# Patient Record
Sex: Male | Born: 1980 | Hispanic: Yes | Marital: Married | Smoking: Never smoker
Health system: Southern US, Community
[De-identification: ages and names within clinical notes are randomized; demographics above are authoritative.]

## PROBLEM LIST (undated history)

## (undated) DIAGNOSIS — N200 Calculus of kidney: Secondary | ICD-10-CM

## (undated) DIAGNOSIS — T884XXA Failed or difficult intubation, initial encounter: Secondary | ICD-10-CM

## (undated) DIAGNOSIS — N529 Male erectile dysfunction, unspecified: Secondary | ICD-10-CM

## (undated) DIAGNOSIS — E7800 Pure hypercholesterolemia, unspecified: Secondary | ICD-10-CM

## (undated) HISTORY — DX: Male erectile dysfunction, unspecified: N52.9

## (undated) HISTORY — PX: LITHOTRIPSY: SUR834

---

## 2005-09-24 ENCOUNTER — Emergency Department: Payer: Self-pay | Admitting: Emergency Medicine

## 2005-12-16 ENCOUNTER — Ambulatory Visit: Payer: Self-pay | Admitting: Specialist

## 2006-09-19 ENCOUNTER — Ambulatory Visit: Payer: Self-pay | Admitting: Specialist

## 2007-09-21 ENCOUNTER — Ambulatory Visit: Payer: Self-pay | Admitting: Specialist

## 2007-10-08 IMAGING — CT CT ABD-PELV W/O CM
1 of 2 series · 15 of 32 positions shown, 19 images · non-contrast
Comparison: none

REASON FOR EXAM: lt flank pain kidney stone disease
COMMENTS:

[Series 2: stone · axial · 0.75mm/px · z∈[-737,-314]mm · 15 of 159 slices shown, 19 images]
[im 12/159  soft-tissue]
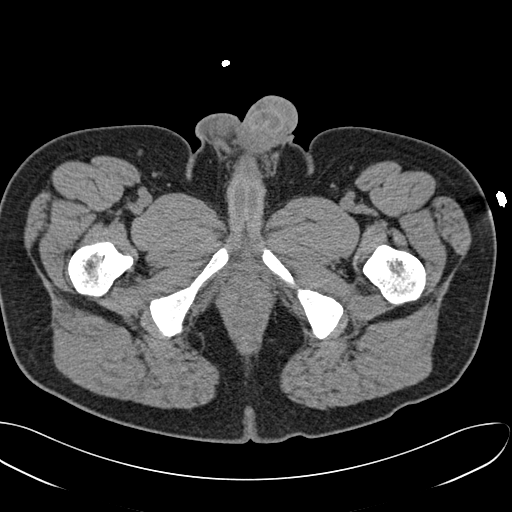
[im 12/159  bone]
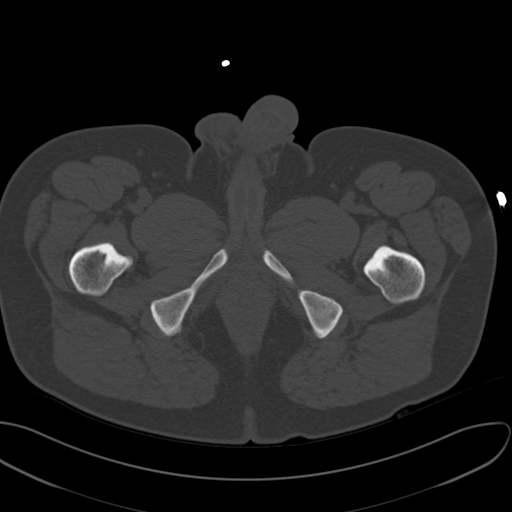
[im 23/159  soft-tissue]
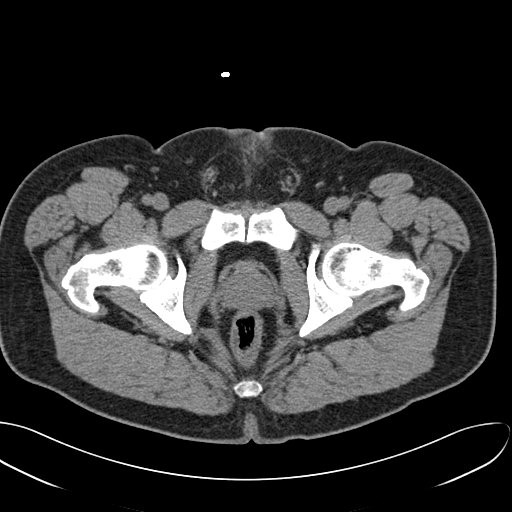
[im 34/159  soft-tissue]
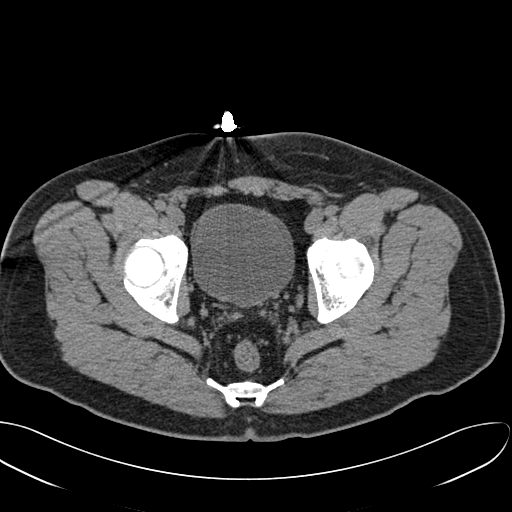
[im 46/159  soft-tissue]
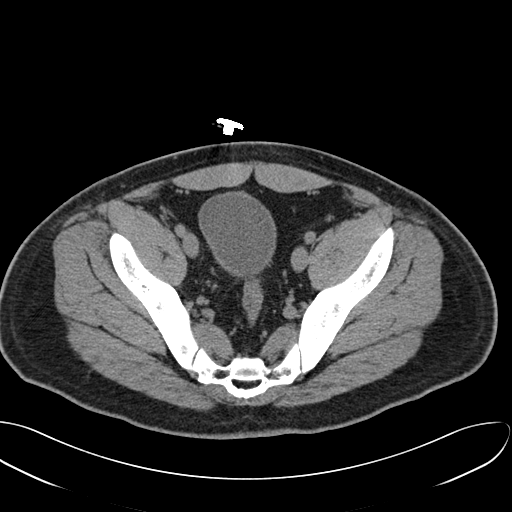
[im 57/159  soft-tissue]
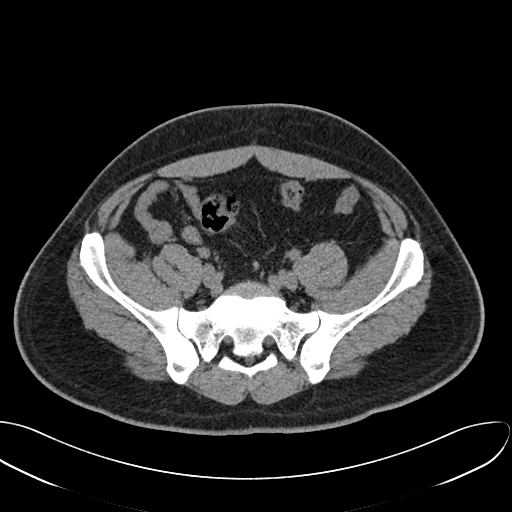
[im 68/159  soft-tissue]
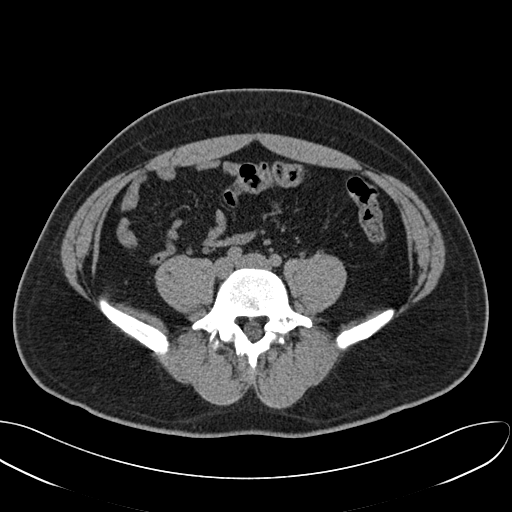
[im 80/159  soft-tissue]
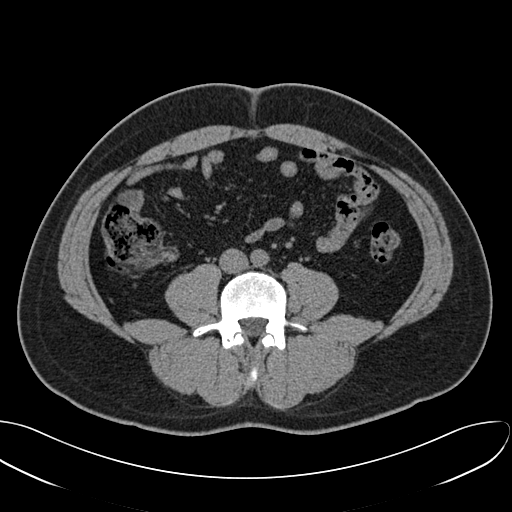
[im 91/159  soft-tissue]
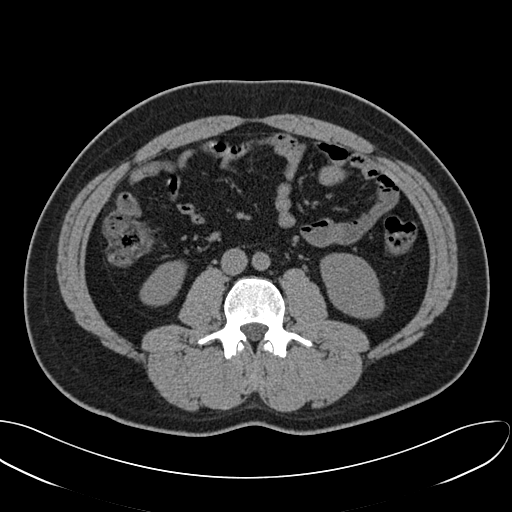
[im 102/159  soft-tissue]
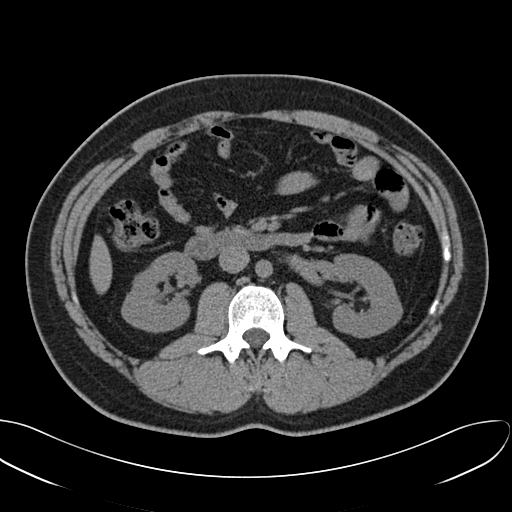
[im 102/159  bone]
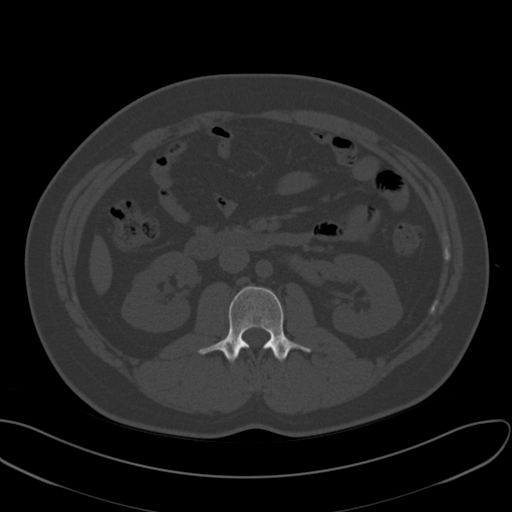
[im 113/159  soft-tissue]
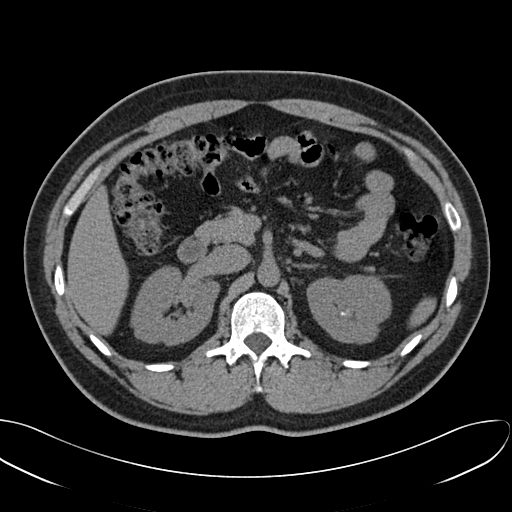
[im 125/159  soft-tissue]
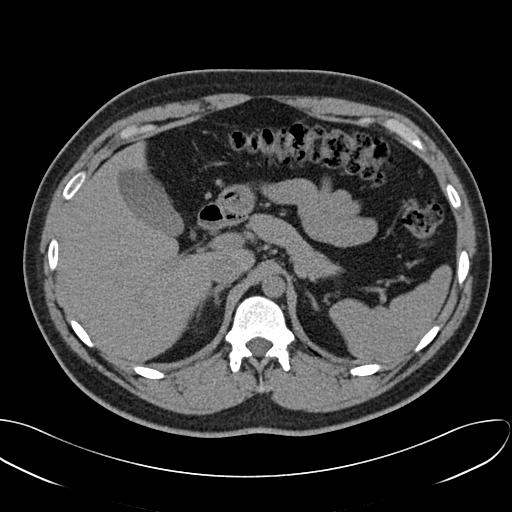
[im 136/159  soft-tissue]
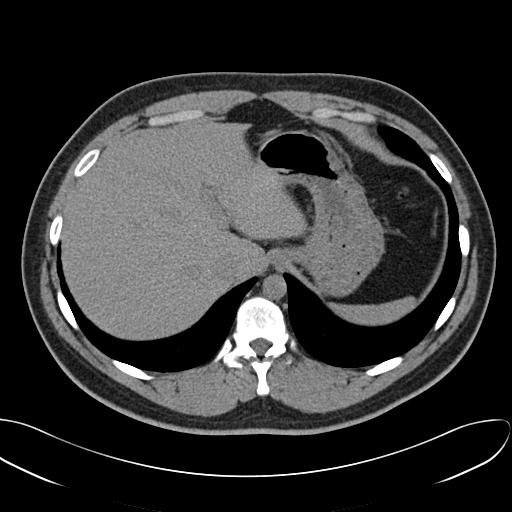
[im 136/159  lung]
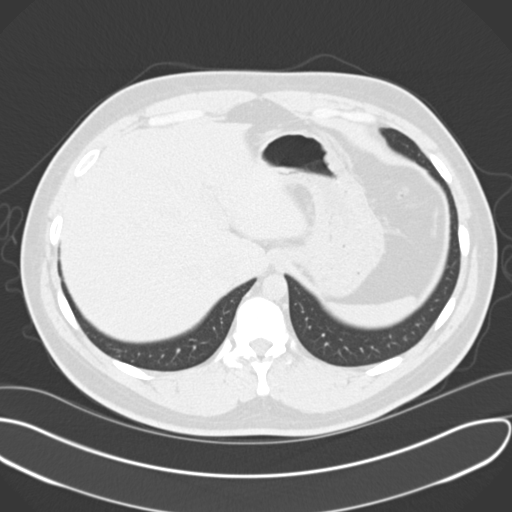
[im 142/159  lung]
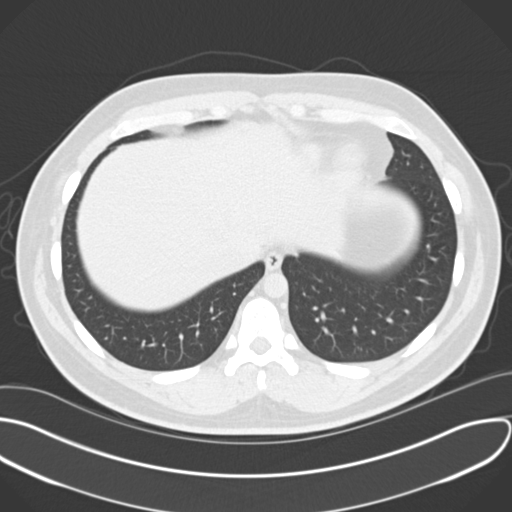
[im 147/159  soft-tissue]
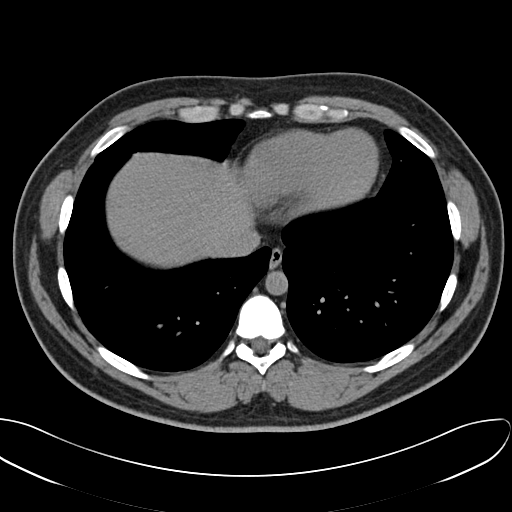
[im 147/159  lung]
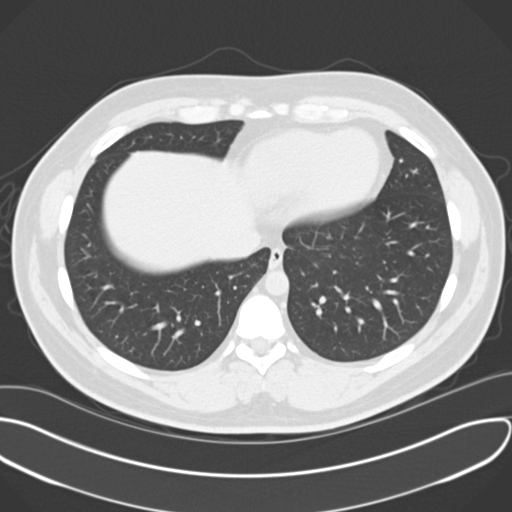
[im 153/159  lung]
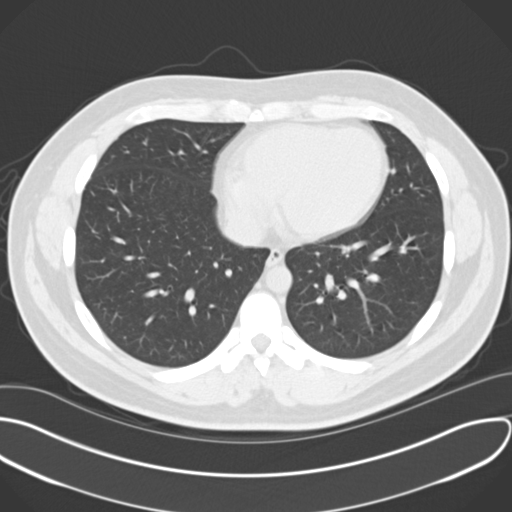

[15 of 32 positions shown; findings below may reference images not displayed]

PROCEDURE:     CT  - CT ABDOMEN AND PELVIS W[DATE] [DATE]

RESULT:     Helical noncontrast 3-mm sections were obtained from the lung
bases through the pubic symphysis. This study was compared to a previous
study dated 09/24/05.

Evaluation of the lung bases demonstrates no gross abnormalities.

Evaluation of the RIGHT kidney demonstrates no evidence of hydronephrosis,
masses or calculi. The LEFT kidney demonstrates a 2-mm calculus within the
central mid to upper pole medullary portion of the kidney. A second 3-mm
calculus is along the along the anterior medullary lower pole portion of the
kidney. Third 1-mm calculus is demonstrated within the posterior medullary
portion of the kidney. The previously described calculus measuring 6-mm
within the medial mid to lower pole region of the kidney is not appreciated
on this study. There is no evidence of hydronephrosis.

Within the limitations of a noncontrasted CT, the liver, spleen, adrenals
and pancreas are unremarkable. There is no evidence of abdominal or pelvic
free fluid or drainable loculated fluid collections, masses or adenopathy.
There does not appear to be CT evidence reflecting the sequela of bowel
obstruction, diverticulitis, colitis, enteritis or appendicitis.
IMPRESSION: Nonobstructing calculi involving the LEFT kidney as described above. The
larger calculus described on the previous study is not appreciated.

## 2007-10-19 ENCOUNTER — Ambulatory Visit: Payer: Self-pay | Admitting: Specialist

## 2011-01-27 ENCOUNTER — Emergency Department: Payer: Self-pay | Admitting: Emergency Medicine

## 2012-03-30 ENCOUNTER — Emergency Department: Payer: Self-pay | Admitting: Emergency Medicine

## 2012-03-30 LAB — COMPREHENSIVE METABOLIC PANEL
Albumin: 4.3 g/dL (ref 3.4–5.0)
Alkaline Phosphatase: 132 U/L (ref 50–136)
Anion Gap: 9 (ref 7–16)
BUN: 11 mg/dL (ref 7–18)
Calcium, Total: 8.7 mg/dL (ref 8.5–10.1)
Chloride: 107 mmol/L (ref 98–107)
Co2: 23 mmol/L (ref 21–32)
Creatinine: 0.72 mg/dL (ref 0.60–1.30)
EGFR (African American): >60
EGFR (Non-African Amer.): >60
Glucose: 121 mg/dL — ABNORMAL HIGH (ref 65–99)
Osmolality: 278 (ref 275–301)
Potassium: 4 mmol/L (ref 3.5–5.1)
SGPT (ALT): 73 U/L (ref 12–78)
Sodium: 139 mmol/L (ref 136–145)
Total Protein: 7.9 g/dL (ref 6.4–8.2)

## 2012-03-30 LAB — URINALYSIS, COMPLETE
Bilirubin,UR: NEGATIVE
Glucose,UR: NEGATIVE mg/dL (ref 0–75)
Ketone: NEGATIVE
Leukocyte Esterase: NEGATIVE
Leukocyte Esterase: NEGATIVE
Nitrite: NEGATIVE
Ph: 6 (ref 4.5–8.0)
Ph: 6 (ref 4.5–8.0)
Protein: NEGATIVE
RBC,UR: 51 /HPF (ref 0–5)
Specific Gravity: 1.015 (ref 1.003–1.030)
WBC UR: 1 /HPF (ref 0–5)
WBC UR: 3 /HPF (ref 0–5)

## 2012-03-30 LAB — CBC
HCT: 44.5 % (ref 40.0–52.0)
HGB: 15.2 g/dL (ref 13.0–18.0)
MCH: 30.1 pg (ref 26.0–34.0)
MCV: 88 fL (ref 80–100)
RDW: 13.1 % (ref 11.5–14.5)

## 2014-05-16 ENCOUNTER — Emergency Department: Payer: Self-pay | Admitting: Emergency Medicine

## 2014-05-23 ENCOUNTER — Ambulatory Visit: Payer: Self-pay | Admitting: Urology

## 2014-12-16 ENCOUNTER — Emergency Department
Admission: EM | Admit: 2014-12-16 | Discharge: 2014-12-16 | Disposition: A | Discharge status: Home / Self Care | Payer: 59 | Attending: Emergency Medicine | Admitting: Emergency Medicine

## 2014-12-16 ENCOUNTER — Encounter: Payer: Self-pay | Admitting: *Deleted

## 2014-12-16 ENCOUNTER — Emergency Department: Payer: 59

## 2014-12-16 DIAGNOSIS — R0602 Shortness of breath: Secondary | ICD-10-CM | POA: Diagnosis not present

## 2014-12-16 DIAGNOSIS — R079 Chest pain, unspecified: Secondary | ICD-10-CM | POA: Diagnosis present

## 2014-12-16 HISTORY — DX: Pure hypercholesterolemia, unspecified: E78.00

## 2014-12-16 LAB — BASIC METABOLIC PANEL
ANION GAP: 5 (ref 5–15)
BUN: 9 mg/dL (ref 6–20)
CHLORIDE: 106 mmol/L (ref 101–111)
CO2: 29 mmol/L (ref 22–32)
CREATININE: 0.7 mg/dL (ref 0.61–1.24)
Calcium: 9.4 mg/dL (ref 8.9–10.3)
GFR calc non Af Amer: >60 mL/min (ref >60)
Glucose, Bld: 150 mg/dL — ABNORMAL HIGH (ref 65–99)
Potassium: 4 mmol/L (ref 3.5–5.1)
SODIUM: 140 mmol/L (ref 135–145)

## 2014-12-16 LAB — ETHANOL

## 2014-12-16 LAB — CK: Total CK: 108 U/L (ref 49–397)

## 2014-12-16 LAB — CBC
HCT: 46.9 % (ref 40.0–52.0)
HEMOGLOBIN: 16.2 g/dL (ref 13.0–18.0)
MCH: 30 pg (ref 26.0–34.0)
MCHC: 34.5 g/dL (ref 32.0–36.0)
MCV: 86.7 fL (ref 80.0–100.0)
PLATELETS: 247 10*3/uL (ref 150–440)
RBC: 5.41 MIL/uL (ref 4.40–5.90)
RDW: 13.1 % (ref 11.5–14.5)
WBC: 9.9 10*3/uL (ref 3.8–10.6)

## 2014-12-16 LAB — HEPATIC FUNCTION PANEL
ALBUMIN: 4.9 g/dL (ref 3.5–5.0)
ALT: 91 U/L — AB (ref 17–63)
AST: 44 U/L — AB (ref 15–41)
Alkaline Phosphatase: 127 U/L — ABNORMAL HIGH (ref 38–126)
BILIRUBIN TOTAL: 0.6 mg/dL (ref 0.3–1.2)
Bilirubin, Direct: 0.1 mg/dL (ref 0.1–0.5)
Indirect Bilirubin: 0.5 mg/dL (ref 0.3–0.9)
Total Protein: 8.1 g/dL (ref 6.5–8.1)

## 2014-12-16 LAB — TROPONIN I

## 2014-12-16 LAB — LIPASE, BLOOD: Lipase: 48 U/L (ref 22–51)

## 2014-12-16 LAB — FIBRIN DERIVATIVES D-DIMER (ARMC ONLY): FIBRIN DERIVATIVES D-DIMER (ARMC): 171 (ref 0–499)

## 2014-12-16 MED ORDER — ASPIRIN 81 MG PO CHEW
324.0000 mg | CHEWABLE_TABLET | Freq: Once | ORAL | Status: AC
Start: 2014-12-16 — End: 2014-12-16
  Administered 2014-12-16: 324 mg via ORAL

## 2014-12-16 MED ORDER — ASPIRIN 81 MG PO CHEW
CHEWABLE_TABLET | ORAL | Status: AC
  Administered 2014-12-16: 324 mg via ORAL
  Filled 2014-12-16: qty 4

## 2014-12-16 NOTE — Discharge Instructions (Signed)
1. Please make an appointment with the cardiologist next week. 2. Return to the ER for worsening symptoms, persistent vomiting, difficulty breathing or other concerns.  Nonspecific Chest Pain  Chest pain can be caused by many different conditions. There is always a chance that your pain could be related to something serious, such as a heart attack or a blood clot in your lungs. Chest pain can also be caused by conditions that are not life-threatening. If you have chest pain, it is very important to follow up with your health care provider. CAUSES  Chest pain can be caused by:  Heartburn.  Pneumonia or bronchitis.  Anxiety or stress.  Inflammation around your heart (pericarditis) or lung (pleuritis or pleurisy).  A blood clot in your lung.  A collapsed lung (pneumothorax). It can develop suddenly on its own (spontaneous pneumothorax) or from trauma to the chest.  Shingles infection (varicella-zoster virus).  Heart attack.  Damage to the bones, muscles, and cartilage that make up your chest wall. This can include:  Bruised bones due to injury.  Strained muscles or cartilage due to frequent or repeated coughing or overwork.  Fracture to one or more ribs.  Sore cartilage due to inflammation (costochondritis). RISK FACTORS  Risk factors for chest pain may include:  Activities that increase your risk for trauma or injury to your chest.  Respiratory infections or conditions that cause frequent coughing.  Medical conditions or overeating that can cause heartburn.  Heart disease or family history of heart disease.  Conditions or health behaviors that increase your risk of developing a blood clot.  Having had chicken pox (varicella zoster). SIGNS AND SYMPTOMS Chest pain can feel like:  Burning or tingling on the surface of your chest or deep in your chest.  Crushing, pressure, aching, or squeezing pain.  Dull or sharp pain that is worse when you move, cough, or take a deep  breath.  Pain that is also felt in your back, neck, shoulder, or arm, or pain that spreads to any of these areas. Your chest pain may come and go, or it may stay constant. DIAGNOSIS Lab tests or other studies may be needed to find the cause of your pain. Your health care provider may have you take a test called an ambulatory ECG (electrocardiogram). An ECG records your heartbeat patterns at the time the test is performed. You may also have other tests, such as:  Transthoracic echocardiogram (TTE). During echocardiography, sound waves are used to create a picture of all of the heart structures and to look at how blood flows through your heart.  Transesophageal echocardiogram (TEE).This is a more advanced imaging test that obtains images from inside your body. It allows your health care provider to see your heart in finer detail.  Cardiac monitoring. This allows your health care provider to monitor your heart rate and rhythm in real time.  Holter monitor. This is a portable device that records your heartbeat and can help to diagnose abnormal heartbeats. It allows your health care provider to track your heart activity for several days, if needed.  Stress tests. These can be done through exercise or by taking medicine that makes your heart beat more quickly.  Blood tests.  Imaging tests. TREATMENT  Your treatment depends on what is causing your chest pain. Treatment may include:  Medicines. These may include:  Acid blockers for heartburn.  Anti-inflammatory medicine.  Pain medicine for inflammatory conditions.  Antibiotic medicine, if an infection is present.  Medicines to dissolve blood  clots.  Medicines to treat coronary artery disease.  Supportive care for conditions that do not require medicines. This may include:  Resting.  Applying heat or cold packs to injured areas.  Limiting activities until pain decreases. HOME CARE INSTRUCTIONS  If you were prescribed an  antibiotic medicine, finish it all even if you start to feel better.  Avoid any activities that bring on chest pain.  Do not use any tobacco products, including cigarettes, chewing tobacco, or electronic cigarettes. If you need help quitting, ask your health care provider.  Do not drink alcohol.  Take medicines only as directed by your health care provider.  Keep all follow-up visits as directed by your health care provider. This is important. This includes any further testing if your chest pain does not go away.  If heartburn is the cause for your chest pain, you may be told to keep your head raised (elevated) while sleeping. This reduces the chance that acid will go from your stomach into your esophagus.  Make lifestyle changes as directed by your health care provider. These may include:  Getting regular exercise. Ask your health care provider to suggest some activities that are safe for you.  Eating a heart-healthy diet. A registered dietitian can help you to learn healthy eating options.  Maintaining a healthy weight.  Managing diabetes, if necessary.  Reducing stress. SEEK MEDICAL CARE IF:  Your chest pain does not go away after treatment.  You have a rash with blisters on your chest.  You have a fever. SEEK IMMEDIATE MEDICAL CARE IF:   Your chest pain is worse.  You have an increasing cough, or you cough up blood.  You have severe abdominal pain.  You have severe weakness.  You faint.  You have chills.  You have sudden, unexplained chest discomfort.  You have sudden, unexplained discomfort in your arms, back, neck, or jaw.  You have shortness of breath at any time.  You suddenly start to sweat, or your skin gets clammy.  You feel nauseous or you vomit.  You suddenly feel light-headed or dizzy.  Your heart begins to beat quickly, or it feels like it is skipping beats. These symptoms may represent a serious problem that is an emergency. Do not wait to  see if the symptoms will go away. Get medical help right away. Call your local emergency services (911 in the [place] Do not drive yourself to the hospital.   This information is not intended to replace advice given to you by your health care provider. Make sure you discuss any questions you have with your health care provider.   Document Released: 12/02/2004 Document Revised: 03/15/2014 Document Reviewed: 09/28/2013 Elsevier Interactive Patient Education [employer].

## 2014-12-16 NOTE — ED Notes (Signed)
Pt c/o L chest pain starting x 1 week ago. Pt states intermittent pain that he characterizes as squeezing. Pt states he cannot sleep because of the pain.

## 2014-12-16 NOTE — ED Notes (Signed)
Pt to xray via stretcher accomp by xray tech 

## 2014-12-16 NOTE — ED Notes (Signed)
Pt returned to room from xray; uprite on stretcher with no distress noted; pt reports left sided CP x week; denies radiating pain, denies accomp symptoms, denies hx of same; resp even/unlab, lungs clear, apical regular; strong periph pulses, -edema; +BS, abd soft/nondist/nontender; card monitor in place

## 2014-12-16 NOTE — ED Provider Notes (Signed)
[facility] Emergency Department Provider Note  ____________________________________________  Time seen: Approximately 2:15 AM  I have reviewed the triage vital signs and the nursing notes.   HISTORY  Chief Complaint Chest Pain    HPI Charles Ellis is a 34 y.o. male who presents to the ED from home with a chief complaint of chest pain. Patient describes onset of left-sided chest pain which she describes as squeezing-type pain approximately one week ago. Describes waxing/waning, nonradiating left chest pain associated with dizziness, diaphoresis and nausea. States he feels like he cannot take a good breath when the pain comes on. Does not note a pattern with the pain. Pain is not exacerbated with eating. Patient presents this evening secondary to being unable to sleep because of the pain. Of note, patient injured his right shoulder and has been out of work 3 weeks; scheduled to return to work this morning. 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 Patient denies fever, chills, cough, congestion, abdominal pain, nausea, vomiting, diarrhea. Patient denies recent travel or trauma. Denies use of hormones. Denies use of herbal medications or stimulants. Patient has never had this pain before.   Past Medical History  Diagnosis Date  . Hypercholesteremia     There are no active problems to display for this patient.   History reviewed. No pertinent past surgical history.  Current Outpatient Rx  Name  Route  Sig  Dispense  Refill  . lovastatin (MEVACOR) 20 MG tablet   Oral   Take 20 mg by mouth at bedtime.           Allergies Review of patient's allergies indicates no known allergies.  Family history None for CAD   Social History Social History  Substance Use Topics  . Smoking status: Never Smoker   . Smokeless tobacco: Never Used  . Alcohol Use: Yes     Comment: occasionally    Review of Systems Constitutional: No  fever/chills Eyes: No visual changes. ENT: No sore throat. Cardiovascular: Positive for chest pain. Respiratory: Positive for shortness of breath. Gastrointestinal: No abdominal pain.  Positive for nausea, no vomiting.  No diarrhea.  No constipation. Genitourinary: Negative for dysuria. Musculoskeletal: Negative for back pain. Skin: Negative for rash. Neurological: Negative for headaches, focal weakness or numbness.  10-point ROS otherwise negative.  ____________________________________________   PHYSICAL EXAM:  VITAL SIGNS: ED Triage Vitals  Enc Vitals Group     BP --      Pulse --      Resp --      Temp --      Temp src --      SpO2 --      Weight --      Height --      Head Cir --      Peak Flow --      Pain Score 12/16/14 0133 4     Pain Loc --      Pain Edu? --      Excl. in GC? --     Constitutional: Alert and oriented. Well appearing and in no acute distress. Eyes: Conjunctivae are normal. PERRL. EOMI. Head: Atraumatic. Nose: No congestion/rhinnorhea. Mouth/Throat: Mucous membranes are moist.  Oropharynx non-erythematous. Neck: No stridor.   Cardiovascular: Normal rate, regular rhythm. Grossly normal heart sounds.  Good peripheral circulation. Respiratory: Normal respiratory effort.  No retractions. Lungs CTAB. Gastrointestinal: Soft and nontender. No distention. No abdominal bruits. No CVA tenderness. Musculoskeletal: No lower extremity tenderness nor  edema.  No joint effusions. Neurologic:  Normal speech and language. No gross focal neurologic deficits are appreciated. No gait instability. Skin:  Skin is warm, dry and intact. No rash noted. Psychiatric: Mood and affect are normal. Speech and behavior are normal.  ____________________________________________   LABS (all labs ordered are listed, but only abnormal results are displayed)  Labs Reviewed  BASIC METABOLIC PANEL - Abnormal; Notable for the following:    Glucose, Bld 150 (*)    All other  components within normal limits  HEPATIC FUNCTION PANEL - Abnormal; Notable for the following:    AST 44 (*)    ALT 91 (*)    Alkaline Phosphatase 127 (*)    All other components within normal limits  CBC  TROPONIN I  LIPASE, BLOOD  ETHANOL  FIBRIN DERIVATIVES D-DIMER ([facility] ONLY)  CK   ____________________________________________  EKG  ED ECG REPORT I, Reason Helzer J, the attending physician, personally viewed and interpreted this ECG.   Date: 12/16/2014  EKG Time: 0131  Rate: 110  Rhythm: sinus tachycardia  Axis: RAD  Intervals:none  ST&T Change: Nonspecific  ____________________________________________  RADIOLOGY  Chest 2 view (view by me, interpreted per Dr. Andria Meuse): No active cardiopulmonary disease. ____________________________________________   PROCEDURES  Procedure(s) performed: None  Critical Care performed: No  ____________________________________________   INITIAL IMPRESSION / ASSESSMENT AND PLAN / ED COURSE  Pertinent labs & imaging results that were available during my care of the patient were reviewed by me and considered in my medical decision making (see chart for details).  34 year old male who presents with a one-week history of intermittent left-sided chest squeezing. Patient is a nonsmoker without family history for CAD; patient carries only a diagnoses of hyperlipidemia. Will obtain screening lab work including troponin, CK, d-dimer; obtain chest x-ray and reassess. Patient improved after aspirin given at triage and currently rates his pain 3/10.  ----------------------------------------- 4:07 AM on 12/16/2014 -----------------------------------------  Patient resting in no acute distress. Voices no complaints of pain. Updated patient of laboratory results. Minimally elevated LFTs may be related to statin use. Will refer for outpatient follow-up with cardiology. Strict return precautions given. Patient verbalizes understanding and agrees with  plan of care. ____________________________________________   FINAL CLINICAL IMPRESSION(S) / ED DIAGNOSES  Final diagnoses:  Chest pain, unspecified chest pain type      Irean Hong, MD 12/16/14 [phone]

## 2015-03-25 ENCOUNTER — Emergency Department
Admission: EM | Admit: 2015-03-25 | Discharge: 2015-03-25 | Disposition: A | Discharge status: Home / Self Care | Payer: 59 | Attending: Emergency Medicine | Admitting: Emergency Medicine

## 2015-03-25 ENCOUNTER — Encounter: Payer: Self-pay | Admitting: Emergency Medicine

## 2015-03-25 DIAGNOSIS — Z79899 Other long term (current) drug therapy: Secondary | ICD-10-CM | POA: Diagnosis not present

## 2015-03-25 DIAGNOSIS — Y92410 Unspecified street and highway as the place of occurrence of the external cause: Secondary | ICD-10-CM | POA: Diagnosis not present

## 2015-03-25 DIAGNOSIS — R05 Cough: Secondary | ICD-10-CM | POA: Diagnosis not present

## 2015-03-25 DIAGNOSIS — R059 Cough, unspecified: Secondary | ICD-10-CM

## 2015-03-25 DIAGNOSIS — E785 Hyperlipidemia, unspecified: Secondary | ICD-10-CM | POA: Diagnosis not present

## 2015-03-25 DIAGNOSIS — S39012A Strain of muscle, fascia and tendon of lower back, initial encounter: Secondary | ICD-10-CM | POA: Diagnosis not present

## 2015-03-25 DIAGNOSIS — Y998 Other external cause status: Secondary | ICD-10-CM | POA: Diagnosis not present

## 2015-03-25 DIAGNOSIS — S3992XA Unspecified injury of lower back, initial encounter: Secondary | ICD-10-CM | POA: Diagnosis present

## 2015-03-25 DIAGNOSIS — Y9389 Activity, other specified: Secondary | ICD-10-CM | POA: Insufficient documentation

## 2015-03-25 MED ORDER — TRAMADOL HCL 50 MG PO TABS: 50.0000 mg | ORAL_TABLET | Freq: Four times a day (QID) | ORAL | Status: DC | PRN

## 2015-03-25 MED ORDER — IBUPROFEN 800 MG PO TABS: 800.0000 mg | ORAL_TABLET | Freq: Three times a day (TID) | ORAL | Status: DC | PRN

## 2015-03-25 MED ORDER — METHOCARBAMOL 750 MG PO TABS: 1500.0000 mg | ORAL_TABLET | Freq: Four times a day (QID) | ORAL | Status: DC

## 2015-03-25 MED ORDER — BENZONATATE 200 MG PO CAPS: 200.0000 mg | ORAL_CAPSULE | Freq: Three times a day (TID) | ORAL | Status: DC | PRN
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
## 2015-04-01 ENCOUNTER — Other Ambulatory Visit: Payer: Self-pay | Admitting: General Practice

## 2015-04-01 ENCOUNTER — Ambulatory Visit
Admission: RE | Admit: 2015-04-01 | Discharge: 2015-04-01 | Disposition: A | Discharge status: Home / Self Care | Payer: 59 | Source: Ambulatory Visit | Attending: General Practice | Admitting: General Practice

## 2015-04-01 DIAGNOSIS — N200 Calculus of kidney: Secondary | ICD-10-CM | POA: Diagnosis not present

## 2015-04-01 DIAGNOSIS — M545 Low back pain: Secondary | ICD-10-CM | POA: Diagnosis present

## 2015-04-01 DIAGNOSIS — M47896 Other spondylosis, lumbar region: Secondary | ICD-10-CM | POA: Diagnosis not present

## 2015-12-27 ENCOUNTER — Emergency Department: Payer: 59

## 2015-12-27 ENCOUNTER — Encounter: Payer: Self-pay | Admitting: Emergency Medicine

## 2015-12-27 ENCOUNTER — Emergency Department
Admission: EM | Admit: 2015-12-27 | Discharge: 2015-12-27 | Disposition: A | Discharge status: Home / Self Care | Payer: 59 | Attending: Emergency Medicine | Admitting: Emergency Medicine

## 2015-12-27 DIAGNOSIS — N200 Calculus of kidney: Secondary | ICD-10-CM

## 2015-12-27 DIAGNOSIS — R1031 Right lower quadrant pain: Secondary | ICD-10-CM

## 2015-12-27 LAB — CBC WITH DIFFERENTIAL/PLATELET
BASOS ABS: 0 10*3/uL (ref 0–0.1)
BASOS PCT: 0 %
EOS ABS: 0.1 10*3/uL (ref 0–0.7)
EOS PCT: 2 %
HEMATOCRIT: 46.4 % (ref 40.0–52.0)
Hemoglobin: 15.2 g/dL (ref 13.0–18.0)
Lymphocytes Relative: 30 %
Lymphs Abs: 1.7 10*3/uL (ref 1.0–3.6)
MCH: 29.3 pg (ref 26.0–34.0)
MCHC: 32.8 g/dL (ref 32.0–36.0)
MCV: 89.5 fL (ref 80.0–100.0)
MONO ABS: 0.4 10*3/uL (ref 0.2–1.0)
MONOS PCT: 7 %
Neutro Abs: 3.5 10*3/uL (ref 1.4–6.5)
Neutrophils Relative %: 61 %
PLATELETS: 233 10*3/uL (ref 150–440)
RBC: 5.18 MIL/uL (ref 4.40–5.90)
RDW: 13.8 % (ref 11.5–14.5)
WBC: 5.7 10*3/uL (ref 3.8–10.6)

## 2015-12-27 LAB — URINALYSIS COMPLETE WITH MICROSCOPIC (ARMC ONLY)
Bacteria, UA: NONE SEEN
Bilirubin Urine: NEGATIVE
Glucose, UA: NEGATIVE mg/dL
KETONES UR: NEGATIVE mg/dL
LEUKOCYTES UA: NEGATIVE
Nitrite: NEGATIVE
PH: 5 (ref 5.0–8.0)
PROTEIN: 100 mg/dL — AB
Specific Gravity, Urine: 1.024 (ref 1.005–1.030)
Squamous Epithelial / LPF: NONE SEEN

## 2015-12-27 LAB — BASIC METABOLIC PANEL
ANION GAP: 7 (ref 5–15)
BUN: 13 mg/dL (ref 6–20)
CO2: 28 mmol/L (ref 22–32)
CREATININE: 0.85 mg/dL (ref 0.61–1.24)
Calcium: 9.4 mg/dL (ref 8.9–10.3)
Chloride: 105 mmol/L (ref 101–111)
Glucose, Bld: 144 mg/dL — ABNORMAL HIGH (ref 65–99)
Potassium: 4 mmol/L (ref 3.5–5.1)
SODIUM: 140 mmol/L (ref 135–145)

## 2015-12-27 MED ORDER — SODIUM CHLORIDE 0.9 % IV BOLUS (SEPSIS)
1000.0000 mL | Freq: Once | INTRAVENOUS | Status: AC
  Administered 2015-12-27: 1000 mL via INTRAVENOUS

## 2015-12-27 MED ORDER — OXYCODONE-ACETAMINOPHEN 5-325 MG PO TABS: 1.0000 | ORAL_TABLET | Freq: Four times a day (QID) | ORAL | 0 refills | Status: DC | PRN

## 2015-12-27 MED ORDER — MORPHINE SULFATE (PF) 2 MG/ML IV SOLN
4.0000 mg | Freq: Once | INTRAVENOUS | Status: AC
  Administered 2015-12-27: 4 mg via INTRAVENOUS
  Filled 2015-12-27: qty 2

## 2015-12-27 MED ORDER — ONDANSETRON HCL 4 MG/2ML IJ SOLN
4.0000 mg | Freq: Once | INTRAMUSCULAR | Status: AC
  Administered 2015-12-27: 4 mg via INTRAVENOUS
  Filled 2015-12-27: qty 2

## 2015-12-27 MED ORDER — TAMSULOSIN HCL 0.4 MG PO CAPS: 0.4000 mg | ORAL_CAPSULE | Freq: Every day | ORAL | 0 refills | Status: DC

## 2015-12-27 MED ORDER — MORPHINE SULFATE (PF) 2 MG/ML IV SOLN: 4.0000 mg | Freq: Once | INTRAVENOUS | Status: DC

## 2015-12-27 MED ORDER — TAMSULOSIN HCL 0.4 MG PO CAPS
0.4000 mg | ORAL_CAPSULE | Freq: Once | ORAL | Status: AC
  Administered 2015-12-27: 0.4 mg via ORAL
  Filled 2015-12-27: qty 1

## 2015-12-27 MED ORDER — KETOROLAC TROMETHAMINE 30 MG/ML IJ SOLN
30.0000 mg | Freq: Once | INTRAMUSCULAR | Status: AC
  Administered 2015-12-27: 30 mg via INTRAVENOUS
  Filled 2015-12-27: qty 1

## 2015-12-27 NOTE — ED Provider Notes (Signed)
[facility] Emergency Department Provider Note  ____________________________________________   First MD Initiated Contact with Patient 12/27/15 [phone]     (approximate)  I have reviewed the triage vital signs and the nursing notes.   HISTORY  Chief Complaint Abdominal Pain   HPI Charles Ellis is a 35 y.o. male with a history of kidney stones was presenting with sudden onset right flank pain which started 3 hours ago. He says that the pain is a 1010 and sharp and radiating into his right lower quadrant as well as his penis and testicles. He says that he had similar pain previously with kidney stones. Denies ever needing a procedure to have a kidney stone passed. Says that he has taken Flomax in the past. Also with several episodes of vomiting. Does not report any diarrhea.   Past Medical History:  Diagnosis Date  . Hypercholesteremia     There are no active problems to display for this patient.   History reviewed. No pertinent surgical history.  Prior to Admission medications   Not on File    Allergies Review of patient's allergies indicates no known allergies.  No family history on file.  Social History Social History  Substance Use Topics  . Smoking status: Never Smoker  . Smokeless tobacco: Never Used  . Alcohol use Yes     Comment: occasionally    Review of Systems Constitutional: No fever/chills Eyes: No visual changes. ENT: No sore throat. Cardiovascular: Denies chest pain. Respiratory: Denies shortness of breath. Gastrointestinal: No diarrhea.  No constipation. Genitourinary: Negative for dysuria. Musculoskeletal: as above Skin: Negative for rash. Neurological: Negative for headaches, focal weakness or numbness.  10-point ROS otherwise negative.  ____________________________________________   PHYSICAL EXAM:  VITAL SIGNS: ED Triage Vitals  Enc Vitals Group     BP 12/27/15 0738 (!) 143/90     Pulse Rate 12/27/15  0738 77     Resp 12/27/15 0738 (!) 22     Temp 12/27/15 0738 98.1 F (36.7 C)     Temp Source 12/27/15 0738 Oral     SpO2 12/27/15 0738 99 %     Weight 12/27/15 0739 200 lb (90.7 kg)     Height 12/27/15 0739 5\' 4"  (1.626 m)     Head Circumference --      Peak Flow --      Pain Score 12/27/15 0739 10     Pain Loc --      Pain Edu? --      Excl. in GC? --     Constitutional: Alert and oriented. Uncomfortable. At the side of the bed holding his right flank. Eyes: Conjunctivae are normal. PERRL. EOMI. Head: Atraumatic. Nose: No congestion/rhinnorhea. Mouth/Throat: Mucous membranes are moist. Neck: No stridor.   Cardiovascular: Normal rate, regular rhythm. Grossly normal heart sounds.   Respiratory: Normal respiratory effort.  No retractions. Lungs CTAB. Gastrointestinal: Soft with mild suprapubic as well as right lower quadrant tenderness palpation. Mild right CVA tenderness to palpation as well.. No distention.  Musculoskeletal: No lower extremity tenderness nor edema.  No joint effusions. Neurologic:  Normal speech and language. No gross focal neurologic deficits are appreciated. No gait instability. Skin:  Skin is warm, dry and intact. No rash noted. Psychiatric: Mood and affect are normal. Speech and behavior are normal.  ____________________________________________   LABS (all labs ordered are listed, but only abnormal results are displayed)  Labs Reviewed  URINALYSIS COMPLETEWITH MICROSCOPIC ([facility] ONLY) - Abnormal; Notable for the following:  Result Value   Color, Urine YELLOW (*)    APPearance CLOUDY (*)    Hgb urine dipstick 3+ (*)    Protein, ur 100 (*)    All other components within normal limits  BASIC METABOLIC PANEL - Abnormal; Notable for the following:    Glucose, Bld 144 (*)    All other components within normal limits  CBC WITH DIFFERENTIAL/PLATELET    ____________________________________________  EKG   ____________________________________________  RADIOLOGY  CT RENAL STONE STUDY (Final result)  Result time 12/27/15 08:13:38  Final result by Alcide Clever, MD (12/27/15 08:13:38)           Narrative:   CLINICAL DATA: Right-sided flank pain for several hours, initial encounter  EXAM: CT ABDOMEN AND PELVIS WITHOUT CONTRAST  TECHNIQUE: Multidetector CT imaging of the abdomen and pelvis was performed following the standard protocol without IV contrast.  COMPARISON: None.  FINDINGS: Lower chest: No acute abnormality.  Hepatobiliary: No focal liver abnormality is seen. No gallstones, gallbladder wall thickening, or biliary dilatation.  Pancreas: Unremarkable. No pancreatic ductal dilatation or surrounding inflammatory changes.  Spleen: Normal in size without focal abnormality.  Adrenals/Urinary Tract: Adrenal glands are within normal limits. The left kidney demonstrates no obstructive change. A tiny 1-2 mm stone is noted in the lower pole of the left kidney. The left ureter is unremarkable. The bladder is decompressed. On the right there is mild to moderate hydronephrosis secondary to a right proximal ureteral stone measuring 8 mm in greatest dimension. The more distal right ureter is within normal limits.  Stomach/Bowel: Stomach is within normal limits. Appendix appears normal. No evidence of bowel wall thickening, distention, or inflammatory changes.  Vascular/Lymphatic: No significant vascular findings are present. No enlarged abdominal or pelvic lymph nodes.  Reproductive: Prostate is unremarkable.  Other: No abdominal wall hernia or abnormality. No abdominopelvic ascites.  Musculoskeletal: No acute or significant osseous findings.  IMPRESSION: 8 mm proximal right ureteral stone with mild to moderate hydronephrosis.   Electronically Signed By: Alcide Clever M.D. On: 12/27/2015 08:13           ____________________________________________   PROCEDURES  Procedure(s) performed:   Procedures  Critical Care performed:   ____________________________________________   INITIAL IMPRESSION / ASSESSMENT AND PLAN / ED COURSE  Pertinent labs & imaging results that were available during my care of the patient were reviewed by me and considered in my medical decision making (see chart for details).    Clinical Course  Comment By Time  Discussed the case with Dr. Sherryl Barters of urology who agrees with Toradol and said that this will not act as a contraindication for the patient requiring a stent of his pain is not able to be controlled. The patient has vomited after morphine and Zofran and is still having sharp pain to his right flank and abdomen. Myrna Blazer, MD 10/21 [phone]  ----------------------------------------- 10:04 AM on 12/27/2015 -----------------------------------------  Patient pain-free at this time after Toradol. I discussed his imaging results and as well as the need for follow-up with urology and to return to the emergency department immediately for any worsening or concerning symptoms. We discussed that it is possible that this stone may not pass on its own and that if the patient has any worsening of his condition which may include pain, nausea vomiting and fever that he must return immediately and that he may then require a procedure to remove the stone. However, at this time he is pain-free. Afebrile. Reassuring labs and without any signs of  infection in the urine. He'll be discharged with Percocet, Flomax as well as a strainer and follow-up with Dr. Sherryl Barters, with the patient says he has seen in the past. He is understanding the plan and willing to comply.  ____________________________________________   FINAL CLINICAL IMPRESSION(S) / ED DIAGNOSES  Final diagnoses:  Right lower quadrant abdominal pain   Kidney stone.   NEW MEDICATIONS STARTED DURING  THIS VISIT:  New Prescriptions   No medications on file     Note:  This document was prepared using Dragon voice recognition software and may include unintentional dictation errors.    Myrna Blazer, MD 12/27/15 1005

## 2015-12-27 NOTE — ED Triage Notes (Signed)
PT presents from home with RLQ abdominal pain and flank pain starting three hours ago. Reports nausea and vomiting. States "I think its a kidney stone". Hx of same. Denies urinary problems at this time.

## 2015-12-27 NOTE — ED Notes (Signed)
Patient transported to CT 

## 2015-12-27 NOTE — ED Notes (Signed)
Pt has a hx of kidney stones. Pt stating that he woke up at 4am with pain and nausea. Pt stating he also had vomiting earlier. Pt appear to be very uncomfortable. Pt is sitting at edge of stretcher moaning at this time. Pt is also clammy. Pt given pain medication.

## 2015-12-27 NOTE — ED Notes (Signed)
Pt transported back to room from CT.  

## 2015-12-29 ENCOUNTER — Emergency Department: Payer: 59 | Admitting: Anesthesiology

## 2015-12-29 ENCOUNTER — Ambulatory Visit: Admit: 2015-12-29 | Payer: 59 | Admitting: Urology

## 2015-12-29 ENCOUNTER — Emergency Department
Admission: EM | Admit: 2015-12-29 | Discharge: 2015-12-29 | Disposition: A | Discharge status: Home / Self Care | Payer: 59 | Attending: Emergency Medicine | Admitting: Emergency Medicine

## 2015-12-29 ENCOUNTER — Emergency Department: Payer: 59

## 2015-12-29 ENCOUNTER — Encounter
Admission: EM | Disposition: A | Discharge status: Home / Self Care | Payer: Self-pay | Source: Home / Self Care | Attending: Emergency Medicine

## 2015-12-29 DIAGNOSIS — Z79899 Other long term (current) drug therapy: Secondary | ICD-10-CM | POA: Insufficient documentation

## 2015-12-29 DIAGNOSIS — Z87442 Personal history of urinary calculi: Secondary | ICD-10-CM | POA: Insufficient documentation

## 2015-12-29 DIAGNOSIS — N200 Calculus of kidney: Secondary | ICD-10-CM

## 2015-12-29 DIAGNOSIS — N201 Calculus of ureter: Secondary | ICD-10-CM | POA: Diagnosis not present

## 2015-12-29 DIAGNOSIS — N132 Hydronephrosis with renal and ureteral calculous obstruction: Secondary | ICD-10-CM | POA: Diagnosis not present

## 2015-12-29 DIAGNOSIS — R1031 Right lower quadrant pain: Secondary | ICD-10-CM | POA: Diagnosis not present

## 2015-12-29 DIAGNOSIS — R112 Nausea with vomiting, unspecified: Secondary | ICD-10-CM | POA: Diagnosis not present

## 2015-12-29 HISTORY — PX: CYSTOSCOPY WITH STENT PLACEMENT: SHX5790

## 2015-12-29 HISTORY — DX: Calculus of kidney: N20.0

## 2015-12-29 LAB — CBC
HEMATOCRIT: 46 % (ref 40.0–52.0)
Hemoglobin: 15.7 g/dL (ref 13.0–18.0)
MCH: 30.2 pg (ref 26.0–34.0)
MCHC: 34.2 g/dL (ref 32.0–36.0)
MCV: 88.5 fL (ref 80.0–100.0)
PLATELETS: 231 10*3/uL (ref 150–440)
RBC: 5.2 MIL/uL (ref 4.40–5.90)
RDW: 13.3 % (ref 11.5–14.5)
WBC: 7 10*3/uL (ref 3.8–10.6)

## 2015-12-29 LAB — BASIC METABOLIC PANEL
Anion gap: 7 (ref 5–15)
BUN: 8 mg/dL (ref 6–20)
CALCIUM: 9.4 mg/dL (ref 8.9–10.3)
CO2: 30 mmol/L (ref 22–32)
CREATININE: 0.67 mg/dL (ref 0.61–1.24)
Chloride: 104 mmol/L (ref 101–111)
GFR calc Af Amer: >60 mL/min (ref >60)
GLUCOSE: 124 mg/dL — AB (ref 65–99)
Potassium: 4.1 mmol/L (ref 3.5–5.1)
Sodium: 141 mmol/L (ref 135–145)

## 2015-12-29 LAB — URINALYSIS COMPLETE WITH MICROSCOPIC (ARMC ONLY)
BACTERIA UA: NONE SEEN
Bilirubin Urine: NEGATIVE
GLUCOSE, UA: NEGATIVE mg/dL
KETONES UR: NEGATIVE mg/dL
LEUKOCYTES UA: NEGATIVE
NITRITE: NEGATIVE
PROTEIN: NEGATIVE mg/dL
SPECIFIC GRAVITY, URINE: 1.008 (ref 1.005–1.030)
Squamous Epithelial / LPF: NONE SEEN
WBC UA: NONE SEEN WBC/hpf (ref 0–5)
pH: 8 (ref 5.0–8.0)

## 2015-12-29 SURGERY — CYSTOSCOPY, WITH STENT INSERTION
Anesthesia: General | Site: Ureter | Laterality: Right | Wound class: Clean Contaminated

## 2015-12-29 MED ORDER — MIDAZOLAM HCL 2 MG/2ML IJ SOLN
INTRAMUSCULAR | Status: DC | PRN
  Administered 2015-12-29: 2 mg via INTRAVENOUS

## 2015-12-29 MED ORDER — FENTANYL CITRATE (PF) 100 MCG/2ML IJ SOLN
50.0000 ug | Freq: Once | INTRAMUSCULAR | Status: AC
  Administered 2015-12-29: 50 ug via INTRAVENOUS
  Filled 2015-12-29: qty 2

## 2015-12-29 MED ORDER — OXYCODONE HCL 5 MG PO TABS
5.0000 mg | ORAL_TABLET | ORAL | Status: DC | PRN
  Administered 2015-12-29: 10 mg via ORAL
  Filled 2015-12-29 (×2): qty 2

## 2015-12-29 MED ORDER — SUCCINYLCHOLINE CHLORIDE 20 MG/ML IJ SOLN
INTRAMUSCULAR | Status: DC | PRN
  Administered 2015-12-29: 100 mg via INTRAVENOUS

## 2015-12-29 MED ORDER — OXYBUTYNIN CHLORIDE 5 MG PO TABS: 5.0000 mg | ORAL_TABLET | Freq: Three times a day (TID) | ORAL | 1 refills | Status: DC | PRN

## 2015-12-29 MED ORDER — ONDANSETRON HCL 4 MG/2ML IJ SOLN
INTRAMUSCULAR | Status: AC
  Administered 2015-12-29: 4 mg via INTRAVENOUS
  Filled 2015-12-29: qty 2

## 2015-12-29 MED ORDER — DEXAMETHASONE SODIUM PHOSPHATE 10 MG/ML IJ SOLN
INTRAMUSCULAR | Status: DC | PRN
  Administered 2015-12-29: 10 mg via INTRAVENOUS

## 2015-12-29 MED ORDER — SODIUM CHLORIDE 0.9 % IV BOLUS (SEPSIS)
1000.0000 mL | Freq: Once | INTRAVENOUS | Status: AC
  Administered 2015-12-29: 1000 mL via INTRAVENOUS

## 2015-12-29 MED ORDER — FENTANYL CITRATE (PF) 100 MCG/2ML IJ SOLN: 25.0000 ug | INTRAMUSCULAR | Status: DC | PRN

## 2015-12-29 MED ORDER — FENTANYL CITRATE (PF) 100 MCG/2ML IJ SOLN
INTRAMUSCULAR | Status: DC | PRN
  Administered 2015-12-29: 100 ug via INTRAVENOUS

## 2015-12-29 MED ORDER — ONDANSETRON HCL 4 MG/2ML IJ SOLN
4.0000 mg | Freq: Once | INTRAMUSCULAR | Status: AC
  Administered 2015-12-29: 4 mg via INTRAVENOUS

## 2015-12-29 MED ORDER — FENTANYL CITRATE (PF) 100 MCG/2ML IJ SOLN
50.0000 ug | Freq: Once | INTRAMUSCULAR | Status: AC
  Administered 2015-12-29: 50 ug via INTRAVENOUS

## 2015-12-29 MED ORDER — SODIUM CHLORIDE 0.9% FLUSH: 3.0000 mL | Freq: Two times a day (BID) | INTRAVENOUS | Status: DC

## 2015-12-29 MED ORDER — ACETAMINOPHEN 325 MG PO TABS: 650.0000 mg | ORAL_TABLET | ORAL | Status: DC | PRN

## 2015-12-29 MED ORDER — FENTANYL CITRATE (PF) 100 MCG/2ML IJ SOLN
INTRAMUSCULAR | Status: AC
  Administered 2015-12-29: 50 ug via INTRAVENOUS
  Filled 2015-12-29: qty 2

## 2015-12-29 MED ORDER — ACETAMINOPHEN 650 MG RE SUPP
650.0000 mg | RECTAL | Status: DC | PRN
  Filled 2015-12-29: qty 1

## 2015-12-29 MED ORDER — ONDANSETRON HCL 4 MG/2ML IJ SOLN
INTRAMUSCULAR | Status: DC | PRN
  Administered 2015-12-29: 4 mg via INTRAVENOUS

## 2015-12-29 MED ORDER — ONDANSETRON HCL 4 MG/2ML IJ SOLN: 4.0000 mg | Freq: Once | INTRAMUSCULAR | Status: DC | PRN

## 2015-12-29 MED ORDER — IOTHALAMATE MEGLUMINE 43 % IV SOLN
INTRAVENOUS | Status: DC | PRN
  Administered 2015-12-29: 10 mL

## 2015-12-29 MED ORDER — LIDOCAINE HCL (CARDIAC) 20 MG/ML IV SOLN
INTRAVENOUS | Status: DC | PRN
Start: 2015-12-29 — End: 2015-12-29
  Administered 2015-12-29: 30 mg via INTRAVENOUS

## 2015-12-29 MED ORDER — SODIUM CHLORIDE 0.9 % IV SOLN: 250.0000 mL | INTRAVENOUS | Status: DC | PRN

## 2015-12-29 MED ORDER — LACTATED RINGERS IV SOLN
INTRAVENOUS | Status: DC | PRN
  Administered 2015-12-29: 21:00:00 via INTRAVENOUS

## 2015-12-29 MED ORDER — OXYCODONE HCL 5 MG PO TABS
ORAL_TABLET | ORAL | Status: AC
  Filled 2015-12-29: qty 2

## 2015-12-29 MED ORDER — PROPOFOL 10 MG/ML IV BOLUS
INTRAVENOUS | Status: DC | PRN
  Administered 2015-12-29: 200 mg via INTRAVENOUS

## 2015-12-29 MED ORDER — CEFAZOLIN SODIUM 1 G IJ SOLR
INTRAMUSCULAR | Status: AC
  Filled 2015-12-29: qty 20

## 2015-12-29 MED ORDER — SODIUM CHLORIDE 0.9% FLUSH: 3.0000 mL | INTRAVENOUS | Status: DC | PRN

## 2015-12-29 SURGICAL SUPPLY — 22 items
BAG DRAIN CYSTO-URO LG1000N (MISCELLANEOUS) ×2 IMPLANT
CATH URETL 5X70 OPEN END (CATHETERS) ×2 IMPLANT
CATH URETL OPEN END 6X70 (CATHETERS) ×2 IMPLANT
CONRAY 43 FOR UROLOGY 50M (MISCELLANEOUS) ×2 IMPLANT
GLOVE BIO SURGEON STRL SZ8 (GLOVE) ×4 IMPLANT
GOWN STRL REUS W/ TWL LRG LVL4 (GOWN DISPOSABLE) ×1 IMPLANT
GOWN STRL REUS W/ TWL XL LVL3 (GOWN DISPOSABLE) ×1 IMPLANT
GOWN STRL REUS W/TWL LRG LVL4 (GOWN DISPOSABLE) ×1
GOWN STRL REUS W/TWL XL LVL3 (GOWN DISPOSABLE) ×1
KIT RM TURNOVER CYSTO AR (KITS) ×2 IMPLANT
PACK CYSTO AR (MISCELLANEOUS) ×2 IMPLANT
PREP PVP WINGED SPONGE (MISCELLANEOUS) ×2 IMPLANT
SENSORWIRE 0.038 NOT ANGLED (WIRE) ×4
SET CYSTO W/LG BORE CLAMP LF (SET/KITS/TRAYS/PACK) ×2 IMPLANT
SOL .9 NS 3000ML IRR  AL (IV SOLUTION) ×1
SOL .9 NS 3000ML IRR UROMATIC (IV SOLUTION) ×1 IMPLANT
STENT URET 6FRX24 CONTOUR (STENTS) ×4 IMPLANT
STENT URET 6FRX26 CONTOUR (STENTS) IMPLANT
SURGILUBE 2OZ TUBE FLIPTOP (MISCELLANEOUS) ×2 IMPLANT
SYRINGE IRR TOOMEY STRL 70CC (SYRINGE) ×2 IMPLANT
WATER STERILE IRR 1000ML POUR (IV SOLUTION) ×2 IMPLANT
WIRE SENSOR 0.038 NOT ANGLED (WIRE) ×2 IMPLANT

## 2015-12-29 NOTE — ED Provider Notes (Signed)
[facility] Emergency Department Provider Note   ____________________________________________   First MD Initiated Contact with Patient 12/29/15 1637     (approximate)  I have reviewed the triage vital signs and the nursing notes.   HISTORY  Chief Complaint Nephrolithiasis   HPI Charles Ellis is a 35 y.o. male with a history of hypercholesterolemia as well as a kidney stone was present to emergency department today as a bounce back with right flank pain. He says the pain is again a 10 out of 10 and sharp and to his right flank and radiating around to his right lower quadrant of his abdomen. He has been taking Percocet as well as Flomax home. His pain was controlled several days ago with Toradol after the discovery of an 8 mm proximal right sided stone.   Past Medical History:  Diagnosis Date  . Hypercholesteremia   . Kidney stone     There are no active problems to display for this patient.   Past Surgical History:  Procedure Laterality Date  . LITHOTRIPSY      Prior to Admission medications   Medication Sig Start Date End Date Taking? Authorizing Provider  oxyCODONE-acetaminophen (ROXICET) 5-325 MG tablet Take 1-2 tablets by mouth every 6 (six) hours as needed. 12/27/15   Myrna Blazeravid Matthew Macaila Tahir, MD  tamsulosin (FLOMAX) 0.4 MG CAPS capsule Take 1 capsule (0.4 mg total) by mouth daily. 12/27/15   Myrna Blazeravid Matthew Nevada Mullett, MD    Allergies Review of patient's allergies indicates no known allergies.  No family history on file.  Social History Social History  Substance Use Topics  . Smoking status: Never Smoker  . Smokeless tobacco: Never Used  . Alcohol use Yes     Comment: occasionally    Review of Systems Constitutional: No fever/chills Eyes: No visual changes. ENT: No sore throat. Cardiovascular: Denies chest pain. Respiratory: Denies shortness of breath. Gastrointestinal: No nausea, no vomiting.  No diarrhea.  No  constipation. Genitourinary: Negative for dysuria. Musculoskeletal: As above. Skin: Negative for rash. Neurological: Negative for headaches, focal weakness or numbness.  10-point ROS otherwise negative.  ____________________________________________   PHYSICAL EXAM:  VITAL SIGNS: ED Triage Vitals [12/29/15 1607]  Enc Vitals Group     BP      Pulse      Resp      Temp      Temp src      SpO2      Weight 200 lb (90.7 kg)     Height 5\' 4"  (1.626 m)     Head Circumference      Peak Flow      Pain Score 9     Pain Loc      Pain Edu?      Excl. in GC?     Constitutional: Alert and oriented. Appears uncomfortable. Rolling around on the bed. Eyes: Conjunctivae are normal. PERRL. EOMI. Head: Atraumatic. Nose: No congestion/rhinnorhea. Mouth/Throat: Mucous membranes are moist.  Neck: No stridor.   Cardiovascular: Normal rate, regular rhythm. Grossly normal heart sounds.  Respiratory: Normal respiratory effort.  No retractions. Lungs CTAB. Gastrointestinal: Soft with mild to moderate right lower quadrant tenderness palpation. No distention. Right-sided CVA tenderness palpation. Musculoskeletal: No lower extremity tenderness nor edema.  No joint effusions. Neurologic:  Normal speech and language. No gross focal neurologic deficits are appreciated. No gait instability. Skin:  Skin is warm, dry and intact. No rash noted. Psychiatric: Mood and affect are normal. Speech and behavior are normal.  ____________________________________________   LABS (all labs ordered are listed, but only abnormal results are displayed)  Labs Reviewed  BASIC METABOLIC PANEL - Abnormal; Notable for the following:       Result Value   Glucose, Bld 124 (*)    All other components within normal limits  CBC  URINALYSIS COMPLETEWITH MICROSCOPIC ([facility] ONLY)   ____________________________________________  EKG   ____________________________________________  RADIOLOGY  DG Abdomen 1 View  (Accession [id number]) (Order [id number])  Imaging  Date: 12/29/2015 Department: [facility] EMERGENCY DEPARTMENT Released By/Authorizing: Myrna Blazer, MD (auto-released)  Exam Information   Status Exam Begun  Exam Ended   Final [99] 12/29/2015 5:14 PM 12/29/2015 5:28 PM  PACS Images   Show images for DG Abdomen 1 View  Study Result   CLINICAL DATA:  Right flank pain.  Right ureteral stone.  EXAM: ABDOMEN - 1 VIEW  COMPARISON:  12/27/2015 unenhanced CT abdomen/pelvis.  FINDINGS: There is a 6 mm right ureteral stone at the level of the L3-4 disc, with minimal antegrade progression since 12/27/2015 CT study. No additional radiopaque stones overlie the kidneys, ureters or bladder. No dilated small bowel loops. Physiologic colonic stool volume. No evidence of pneumatosis or pneumoperitoneum. Clear lung bases.  IMPRESSION: Minimal interval antegrade progression of the 6 mm right ureteral stone, now located at the level of the L3-4 disc.   Electronically Signed   By: Delbert Phenix M.D.   On: 12/29/2015 17:34     ____________________________________________   PROCEDURES  Procedure(s) performed:   Procedures  Critical Care performed:   ____________________________________________   INITIAL IMPRESSION / ASSESSMENT AND PLAN / ED COURSE  Pertinent labs & imaging results that were available during my care of the patient were reviewed by me and considered in my medical decision making (see chart for details).    Clinical Course  Comment By Time  Discussed case with urologist, Dr. Orvis Brill, who recommends getting a KUB to see if the stone is moved. The stone has not moved and the patient will likely need stenting. Myrna Blazer, MD 10/23 1655   ----------------------------------------- 6:02 PM on 12/29/2015 -----------------------------------------  Patient says the pain as a 5 out of 10 at this time. I discussed  imaging with the urologist and he asked that I discussed with the patient if he would like to see him as an outpatient or if he would like a stent placed at this time. I spoke with the patient and he says that he would like the procedure because he is concerned that he'll end up back in the emergency department. I discussed this with urologist, Dr. Orvis Brill will be in the hospital see the patient and take him to the operating room for stenting.  ____________________________________________   FINAL CLINICAL IMPRESSION(S) / ED DIAGNOSES  Intractable flank pain secondary to kidney stone.    NEW MEDICATIONS STARTED DURING THIS VISIT:  New Prescriptions   No medications on file     Note:  This document was prepared using Dragon voice recognition software and may include unintentional dictation errors.    Myrna Blazer, MD 12/29/15 [phone]

## 2015-12-29 NOTE — Anesthesia Procedure Notes (Signed)
Procedure Name: Intubation Date/Time: 12/29/2015 8:38 PM Performed by: Waldo LaineJUSTIS, Mandell Pangborn Pre-anesthesia Checklist: Emergency Drugs available, Patient identified, Suction available, Patient being monitored and Timeout performed Patient Re-evaluated:Patient Re-evaluated prior to inductionOxygen Delivery Method: Circle system utilized Preoxygenation: Pre-oxygenation with 100% oxygen Intubation Type: IV induction, Rapid sequence and Cricoid Pressure applied Laryngoscope Size: Miller and 2 Grade View: Grade III Number of attempts: 2 Airway Equipment and Method: Patient positioned with wedge pillow and Stylet Placement Confirmation: ETT inserted through vocal cords under direct vision,  positive ETCO2 and breath sounds checked- equal and bilateral Secured at: 21 cm Tube secured with: Tape Dental Injury: Teeth and Oropharynx as per pre-operative assessment

## 2015-12-29 NOTE — ED Triage Notes (Addendum)
Pt c/o right flank pain since Saturday, was seen here and was ok until today the pain increased..pt states he took oxycodone for pain about 30min PTA

## 2015-12-29 NOTE — ED Notes (Signed)
Pt transport to OR at this time.

## 2015-12-29 NOTE — OR Nursing (Signed)
Instructions given to patient and family, patient up to bathroom and voided red tinged urine.

## 2015-12-29 NOTE — Op Note (Signed)
Preoperative diagnosis: Obstructing, symptomatic 8 millimeter right proximal ureteral stone.  Postoperative diagnosis: Same  Principal procedure: Cystoscopy, right retrograde ureteropyelogram, fluoroscopic interpretation, placement of 6 French by 24 centimeter contour double-J stent without tether  Surgeon: Jose Corvin  Anesthesia: Gen. endotracheal.  Complications: None.  Drains: Above-mentioned stent.  Specimens: None.  Estimated blood loss: None.  Indications: 35 year old male with significantly symptomatic right proximal ureteral stone.  He failed outpatient management with pain medicine, having presented to the emergency room a second time tonight with this 8mm right proximal ureteral stone that has not moved over the past 2 days and has significant proximal hydronephrosis.  Urologic consultation was requested, and it was recommended, since the patient had failed outpatient medical expulsive therapy, that he had a stent placed in anticipation of eventual shockwave lithotripsy or ureteroscopy with holmium laser lithotripsy and extraction of the stone.  The patient agrees with placement of the stent.  Risks and complications of this anesthetic procedure have been discussed with the patient.  He understands and desires to proceed.  Findings: Bladder revealed normal urothelium, ureteral orifices were normal.  Right retrograde ureteropyelogram was performed with Omnipaque.  This revealed a normal mid and distal ureter, without stricture or filling defects.  The proximal ureter was associated with a filling defect with proximal hydroureteronephrosis, consistent with the obstructing stone.  Pyelocaliectasis was noted, but no filling defects were seen within the collecting system on the right.  Description of procedure: The patient was properly identified and marked in the holding area.  He received preoperative IV Cipro.  He was taken the operating room where general anesthetic was administered.   He was placed in the dorsolithotomy position.  Genitalia and perineum were prepped and draped.  Proper timeout was performed.  A 23 French panendoscope was advanced through his urethra under direct vision using the Foroblique lens.  The bladder was inspected circumferentially and found to be normal.  The right ureteral orifice was cannulated with a 6 [place] open-ended catheter, and retrograde ureteropyelogram was performed, with the above-mentioned findings.  Following the pyelogram, a 0.038 inch sensor-tip guidewire was advanced through the open-ended catheter, passed the ureteral calculus and in the upper pole calyceal system, were curl was seen.  The open-ended catheter was then removed.  Using fluoroscopic and cystoscopic guidance, a 6 [place] by 24 centimeter contour double-J stent was deployed in the right ureter with excellent proximal and distal curl seen using fluoroscopy and cystoscopy, respectively.  At this point, the bladder was drained.  The scope was removed and the patient was awakened and taken to the PACU in stable condition.  We will ask the patient to call our [facility] urological office in the near future to set up an appointment with Dr. Apolinar JunesBrandon or the other physician on duty that day to schedule eventual ureteroscopy or shockwave lithotripsy.

## 2015-12-29 NOTE — Discharge Instructions (Signed)
1. You may see some blood in the urine and may have some burning with urination for 48-72 hours. You also may notice that you have to urinate more frequently or urgently after your procedure which is normal.  2. You should call should you develop an inability urinate, fever > 101, persistent nausea and vomiting that prevents you from eating or drinking to stay hydrated.  3. If you have a stent, you will likely urinate more frequently and urgently until the stent is removed and you may experience some discomfort/pain in the lower abdomen and flank especially when urinating. You may take pain medication prescribed to you if needed for pain. You may also intermittently have blood in the urine until the stent is removed. 4. If you have a catheter, you will be taught how to take care of the catheter by the nursing staff prior to discharge from the hospital.  You may periodically feel a strong urge to void with the catheter in place.  This is a bladder spasm and most often can occur when having a bowel movement or moving around. It is typically self-limited and usually will stop after a few minutes.  You may use some Vaseline or Neosporin around the tip of the catheter to reduce friction at the tip of the penis. You may also see some blood in the urine.  A very small amount of blood can make the urine look quite red.  As long as the catheter is draining well, there usually is not a problem.  However, if the catheter is not draining well and is bloody, you should call the office [phone]) to notify us.   AMBULATORY SURGERY  DISCHARGE INSTRUCTIONS   1) The drugs that you were given will stay in your system until tomorrow so for the next 24 hours you should not:  A) Drive an automobile B) Make any legal decisions C) Drink any alcoholic beverage   2) You may resume regular meals tomorrow.  Today it is better to start with liquids and gradually work up to solid foods.  You may eat anything you prefer,  but it is better to start with liquids, then soup and crackers, and gradually work up to solid foods.   3) Please notify your doctor immediately if you have any unusual bleeding, trouble breathing, redness and pain at the surgery site, drainage, fever, or pain not relieved by medication.    4) Additional Instructions:        Please contact your physician with any problems or Same Day Surgery at [phone], Monday through Friday 6 am to 4 pm, or [place] at [facility] Main number at [phone].

## 2015-12-29 NOTE — Anesthesia Preprocedure Evaluation (Signed)
Anesthesia Evaluation  Patient identified by MRN, date of birth, ID band Patient awake    Reviewed: Allergy & Precautions, H&P , NPO status , Patient's Chart, lab work & pertinent test results, reviewed documented beta blocker date and time   Airway Mallampati: II  TM Distance: >3 FB Neck ROM: full    Dental  (+) Teeth Intact   Pulmonary neg pulmonary ROS,    Pulmonary exam normal        Cardiovascular Exercise Tolerance: Good negative cardio ROS Normal cardiovascular exam Rate:Normal     Neuro/Psych negative neurological ROS  negative psych ROS   GI/Hepatic negative GI ROS, Neg liver ROS,   Endo/Other  negative endocrine ROS  Renal/GU Renal diseasenegative Renal ROS  negative genitourinary   Musculoskeletal   Abdominal   Peds  Hematology negative hematology ROS (+)   Anesthesia Other Findings   Reproductive/Obstetrics negative OB ROS                             Anesthesia Physical Anesthesia Plan  ASA: II and emergent  Anesthesia Plan: General LMA   Post-op Pain Management:    Induction:   Airway Management Planned:   Additional Equipment:   Intra-op Plan:   Post-operative Plan:   Informed Consent: I have reviewed the patients History and Physical, chart, labs and discussed the procedure including the risks, benefits and alternatives for the proposed anesthesia with the patient or authorized representative who has indicated his/her understanding and acceptance.     Plan Discussed with: CRNA  Anesthesia Plan Comments:         Anesthesia Quick Evaluation  

## 2015-12-29 NOTE — H&P (Addendum)
Urology History and Physical Exam  CC: Kidney stone  HPI: 35 year old male is admitted at this time for definitive management of a right proximal ureteral stone.  The patient has a prior history of urolithiasis, having had lithotripsy in the remote past.  He presented to the emergency room here at [facility] 2 days ago with sudden onset of right flank pain, nausea and vomiting.  CT stone protocol was performed revealing a right proximal ureteral stone 8 millimeters in size.  He was sent home after appropriate pain management.  He re-presented to the emergency room earlier today with persistent pain, nausea and vomiting, not relieved with pain medicine.  Urologic consultation is requested for further management.  The patient has had persistent nausea and vomiting but no fever, chills, no recent gross hematuria.  PMH: Past Medical History:  Diagnosis Date  . Hypercholesteremia   . Kidney stone     PSH: Past Surgical History:  Procedure Laterality Date  . LITHOTRIPSY      Allergies: No Known Allergies  Medications:  (Not in a hospital admission)   Social History: Social History   Social History  . Marital status: Married    Spouse name: N/A  . Number of children: N/A  . Years of education: N/A   Occupational History  . Not on file.   Social History Main Topics  . Smoking status: Never Smoker  . Smokeless tobacco: Never Used  . Alcohol use Yes     Comment: occasionally  . Drug use: No  . Sexual activity: No   Other Topics Concern  . Not on file   Social History Narrative  . No narrative on file    Family History: No family history on file.  Review of Systems: Positive: Right flank/back pain, nausea, vomiting Negative: A further 10 point review of systems was negative except what is listed in the HPI.                  Physical Exam: @VITALS2 @ General: No acute distress.  Awake. Head:  Normocephalic.  Atraumatic. ENT:  EOMI.  Mucous  membranes moist Neck:  Supple.  No lymphadenopathy. CV:  S1 present. S2 present. Regular rate. Pulmonary: Equal effort bilaterally.  Clear to auscultation bilaterally. Abdomen: Soft.  Mild right lower quadrant and right CVA tenderness.  No rebound or guarding. Skin:  Normal turgor.  No visible rash. Extremity: No gross deformity of bilateral upper extremities.  No gross deformity of                             lower extremities. Neurologic: Alert. Appropriate mood.    Studies:  Recent Labs     12/27/15  0745  12/29/15  1621  HGB  15.2  15.7  WBC  5.7  7.0  PLT  233  231    Recent Labs     12/27/15  0745  12/29/15  1621  NA  140  141  K  4.0  4.1  CL  105  104  CO2  28  30  BUN  13  8  CREATININE  0.85  0.67  CALCIUM  9.4  9.4  GFRNONAA  >60  >60  GFRAA  >60  >60     No results for input(s): INR, APTT in the last 72 hours.  Invalid input(s): PT   Invalid input(s): ABG  CT urogram was reviewed, as well as recent  KUB series Hounsfield units-just over 1 thousand.  Skin to stone distance 14 centimeters.  There is mild right hydronephrosis and this previously mentioned right upper ureteral stone.  Assessment:  Moderate size right UPJ stone without significant passage/distal movement  Plan: I've spoken with the patient regarding treatment options.  We discussed waiting and scheduling him for extracorporeal shockwave lithotripsy versus placing a stent at this point to relieve his pain, and making him pain-free until he is treated with lithotripsy or, another option, laser management of the stone.  The patient would like to proceed with this stent placement.  Discussed the procedure as well as other options.  He desires to proceed.  Addendum: The patient apparently last ate at 2 p.m.  However, he has had nausea and vomiting since that time.  I feel that, 6 hours after he had his last meal, with him having emesis, and this being a urgent/emergent procedure to rid the patient  of his pain, the emergent nature of the procedure.  Takes precedence over the fact that he had some oral intake  6 hours ago.

## 2015-12-29 NOTE — Transfer of Care (Signed)
Immediate Anesthesia Transfer of Care Note  Patient: Charles Ellis  Procedure(s) Performed: Procedure(s): CYSTOSCOPY WITH STENT PLACEMENT (Right)  Patient Location: PACU  Anesthesia Type:General  Level of Consciousness: awake, alert , oriented and patient cooperative  Airway & Oxygen Therapy: Patient Spontanous Breathing and Patient connected to face mask oxygen  Post-op Assessment: Report given to RN and Post -op Vital signs reviewed and stable  Post vital signs: Reviewed and stable  Last Vitals:  Vitals:   12/29/15 1931 12/29/15 2009  BP: 114/71 125/77  Pulse: 89 87  Resp: 18 18  Temp: 36.7 C 36.8 C    Last Pain:  Vitals:   12/29/15 2020  TempSrc:   PainSc: 4          Complications: No apparent anesthesia complications

## 2015-12-29 NOTE — ED Notes (Signed)
Pt unable to urinate, ER MD aware stated to finish IV fluids try if cant void do IN and Out cath. No distress noted. Will continue to monitor.

## 2015-12-30 ENCOUNTER — Encounter: Payer: Self-pay | Admitting: Urology

## 2016-01-01 ENCOUNTER — Ambulatory Visit: Payer: 59 | Admitting: Anesthesiology

## 2016-01-01 ENCOUNTER — Encounter
Admission: RE | Disposition: A | Discharge status: Home / Self Care | Payer: Self-pay | Source: Ambulatory Visit | Attending: Urology

## 2016-01-01 ENCOUNTER — Ambulatory Visit
Admission: RE | Admit: 2016-01-01 | Discharge: 2016-01-01 | Disposition: A | Discharge status: Home / Self Care | Payer: 59 | Source: Ambulatory Visit | Attending: Urology | Admitting: Urology

## 2016-01-01 ENCOUNTER — Telehealth: Payer: Self-pay | Admitting: Urology

## 2016-01-01 ENCOUNTER — Encounter: Payer: Self-pay | Admitting: Anesthesiology

## 2016-01-01 DIAGNOSIS — N201 Calculus of ureter: Secondary | ICD-10-CM

## 2016-01-01 HISTORY — PX: URETEROSCOPY WITH HOLMIUM LASER LITHOTRIPSY: SHX6645

## 2016-01-01 HISTORY — PX: STENT REMOVAL: SHX6421

## 2016-01-01 HISTORY — DX: Failed or difficult intubation, initial encounter: T88.4XXA

## 2016-01-01 SURGERY — URETEROSCOPY, WITH LITHOTRIPSY USING HOLMIUM LASER
Anesthesia: General | Site: Ureter | Laterality: Right | Wound class: Clean Contaminated

## 2016-01-01 MED ORDER — TAMSULOSIN HCL 0.4 MG PO CAPS: 0.4000 mg | ORAL_CAPSULE | Freq: Every day | ORAL | 11 refills | Status: AC

## 2016-01-01 MED ORDER — LEVOFLOXACIN IN D5W 500 MG/100ML IV SOLN
INTRAVENOUS | Status: AC
  Filled 2016-01-01: qty 100

## 2016-01-01 MED ORDER — BELLADONNA ALKALOIDS-OPIUM 16.2-60 MG RE SUPP
RECTAL | Status: AC
  Filled 2016-01-01: qty 1

## 2016-01-01 MED ORDER — OXYCODONE-ACETAMINOPHEN 10-325 MG PO TABS: 1.0000 | ORAL_TABLET | ORAL | 0 refills | Status: DC | PRN

## 2016-01-01 MED ORDER — LIDOCAINE HCL 2 % EX GEL
CUTANEOUS | Status: DC | PRN
  Administered 2016-01-01: 1

## 2016-01-01 MED ORDER — MIDAZOLAM HCL 2 MG/2ML IJ SOLN
INTRAMUSCULAR | Status: DC | PRN
Start: 2016-01-01 — End: 2016-01-01
  Administered 2016-01-01: 2 mg via INTRAVENOUS

## 2016-01-01 MED ORDER — OXYCODONE HCL 5 MG/5ML PO SOLN: 5.0000 mg | Freq: Once | ORAL | Status: DC | PRN

## 2016-01-01 MED ORDER — PROPOFOL 10 MG/ML IV BOLUS
INTRAVENOUS | Status: DC | PRN
  Administered 2016-01-01: 20 mg via INTRAVENOUS
  Administered 2016-01-01: 180 mg via INTRAVENOUS

## 2016-01-01 MED ORDER — KETOROLAC TROMETHAMINE 30 MG/ML IJ SOLN
INTRAMUSCULAR | Status: DC | PRN
  Administered 2016-01-01: 30 mg via INTRAVENOUS

## 2016-01-01 MED ORDER — LEVOFLOXACIN 500 MG PO TABS: 500.0000 mg | ORAL_TABLET | Freq: Every day | ORAL | 1 refills | Status: DC

## 2016-01-01 MED ORDER — LACTATED RINGERS IV SOLN
INTRAVENOUS | Status: DC
  Administered 2016-01-01: 12:00:00 via INTRAVENOUS

## 2016-01-01 MED ORDER — NUCYNTA 50 MG PO TABS: 50.0000 mg | ORAL_TABLET | Freq: Four times a day (QID) | ORAL | 0 refills | Status: DC | PRN

## 2016-01-01 MED ORDER — ROCURONIUM BROMIDE 100 MG/10ML IV SOLN
INTRAVENOUS | Status: DC | PRN
  Administered 2016-01-01: 5 mg via INTRAVENOUS
  Administered 2016-01-01: 10 mg via INTRAVENOUS
  Administered 2016-01-01: 30 mg via INTRAVENOUS

## 2016-01-01 MED ORDER — IOTHALAMATE MEGLUMINE 43 % IV SOLN
INTRAVENOUS | Status: DC | PRN
  Administered 2016-01-01: 20 mL

## 2016-01-01 MED ORDER — DOCUSATE SODIUM 100 MG PO CAPS: 200.0000 mg | ORAL_CAPSULE | Freq: Two times a day (BID) | ORAL | 3 refills | Status: DC

## 2016-01-01 MED ORDER — FENTANYL CITRATE (PF) 100 MCG/2ML IJ SOLN
INTRAMUSCULAR | Status: DC | PRN
  Administered 2016-01-01 (×3): 50 ug via INTRAVENOUS

## 2016-01-01 MED ORDER — FENTANYL CITRATE (PF) 100 MCG/2ML IJ SOLN: 25.0000 ug | INTRAMUSCULAR | Status: DC | PRN

## 2016-01-01 MED ORDER — SUGAMMADEX SODIUM 200 MG/2ML IV SOLN
INTRAVENOUS | Status: DC | PRN
  Administered 2016-01-01: 190 mg via INTRAVENOUS

## 2016-01-01 MED ORDER — LIDOCAINE HCL 2 % EX GEL
CUTANEOUS | Status: AC
  Filled 2016-01-01: qty 10

## 2016-01-01 MED ORDER — BELLADONNA ALKALOIDS-OPIUM 16.2-60 MG RE SUPP
RECTAL | Status: DC | PRN
  Administered 2016-01-01: 1 via RECTAL

## 2016-01-01 MED ORDER — OXYCODONE HCL 5 MG PO TABS: 5.0000 mg | ORAL_TABLET | Freq: Once | ORAL | Status: DC | PRN

## 2016-01-01 MED ORDER — ONDANSETRON 8 MG PO TBDP: 8.0000 mg | ORAL_TABLET | Freq: Four times a day (QID) | ORAL | 3 refills | Status: DC | PRN

## 2016-01-01 MED ORDER — SUCCINYLCHOLINE CHLORIDE 20 MG/ML IJ SOLN
INTRAMUSCULAR | Status: DC | PRN
  Administered 2016-01-01: 100 mg via INTRAVENOUS

## 2016-01-01 MED ORDER — LEVOFLOXACIN IN D5W 500 MG/100ML IV SOLN
500.0000 mg | Freq: Once | INTRAVENOUS | Status: AC
  Administered 2016-01-01: 500 mg via INTRAVENOUS

## 2016-01-01 MED ORDER — LIDOCAINE HCL (CARDIAC) 20 MG/ML IV SOLN
INTRAVENOUS | Status: DC | PRN
Start: 2016-01-01 — End: 2016-01-01
  Administered 2016-01-01: 100 mg via INTRAVENOUS

## 2016-01-01 SURGICAL SUPPLY — 23 items
BAG DRAIN CYSTO-URO LG1000N (MISCELLANEOUS) ×3 IMPLANT
CNTNR SPEC 2.5X3XGRAD LEK (MISCELLANEOUS) ×2
CONRAY 43 FOR UROLOGY 50M (MISCELLANEOUS) ×3 IMPLANT
CONT SPEC 4OZ STER OR WHT (MISCELLANEOUS) ×1
CONTAINER SPEC 2.5X3XGRAD LEK (MISCELLANEOUS) ×2 IMPLANT
GLOVE BIO SURGEON STRL SZ7 (GLOVE) ×6 IMPLANT
GLOVE BIO SURGEON STRL SZ7.5 (GLOVE) ×3 IMPLANT
GOWN STRL REUS W/ TWL LRG LVL4 (GOWN DISPOSABLE) ×2 IMPLANT
GOWN STRL REUS W/TWL LRG LVL4 (GOWN DISPOSABLE) ×1
GOWN STRL REUS W/TWL XL LVL4 (GOWN DISPOSABLE) ×3 IMPLANT
GUIDEWIRE STR ZIPWIRE 035X150 (MISCELLANEOUS) ×3 IMPLANT
KIT RM TURNOVER CYSTO AR (KITS) ×3 IMPLANT
LASER FIBER 550M SMARTSCOPE (Laser) ×3 IMPLANT
LASER HOLMIUM FIBER SU 272UM (MISCELLANEOUS) IMPLANT
PACK CYSTO AR (MISCELLANEOUS) ×3 IMPLANT
PREP PVP WINGED SPONGE (MISCELLANEOUS) IMPLANT
SET CYSTO W/LG BORE CLAMP LF (SET/KITS/TRAYS/PACK) ×3 IMPLANT
SOL .9 NS 3000ML IRR  AL (IV SOLUTION) ×1
SOL .9 NS 3000ML IRR UROMATIC (IV SOLUTION) ×2 IMPLANT
SOL PREP PVP 2OZ (MISCELLANEOUS) ×3
SOLUTION PREP PVP 2OZ (MISCELLANEOUS) ×2 IMPLANT
SURGILUBE 2OZ TUBE FLIPTOP (MISCELLANEOUS) ×3 IMPLANT
WATER STERILE IRR 1000ML POUR (IV SOLUTION) ×3 IMPLANT

## 2016-01-01 NOTE — H&P (Signed)
NAMNathaneil Ellis:  Ellis, Charles Ellis               ACCOUNT NO.:  [id number]  MEDICAL RECORD NO.:  [id number]  LOCATION:                                 FACILITY:  PHYSICIAN:  Charles Ellis          DATE OF BIRTH:  1980/11/05  DATE OF ADMISSION:  01/01/2016 DATE OF DISCHARGE:                            HISTORY AND PHYSICAL   Same-day surgery, October 26.  CHIEF COMPLAINT:  Right flank pain.  HISTORY OF PRESENT ILLNESS:  [name] is a 35 year old Hispanic male with right renal colic, who underwent cystoscopy with stent placement on October 23.  He had an 8 mm right proximal stone at that time and seen with obstruction.  He comes in now for right ureteroscopic ureterolithotomy with holmium laser lithotripsy and stent retrieval. The patient underwent stent placement on October 23.  PAST MEDICAL HISTORY:  No drug allergies.  CURRENT MEDICATIONS:  Oxybutynin and oxycodone.  PAST SURGICAL HISTORY:  ESWL in 2008 and 2016.  SOCIAL HISTORY:  The patient denied tobacco use.  He consumes 1-4 alcoholic beverages per week.  FAMILY HISTORY:  Father has hypertension.  There is no family history of kidney stones or heart disease.  PAST AND CURRENT MEDICAL CONDITIONS: 1. Hypercholesterolemia. 2. Recurrent kidney stone disease.  REVIEW OF SYSTEMS:  Patient denied chest pain, shortness of breath, diabetes, stroke, or hypertension.  PHYSICAL EXAMINATION:  GENERAL:  Well-nourished, Hispanic male, in no acute distress. HEENT:  Sclerae clear.  Pupils are equally round, reactive to light and accommodation.  Extraocular movements are intact. NECK:  Supple.  No palpable cervical adenopathy.  Thyroid gland was smooth and nontender without nodules. LUNGS:  Clear to auscultation. CARDIOVASCULAR:  Regular rhythm and rate without audible murmurs. ABDOMEN:  Soft, nontender abdomen. GU AND RECTAL:  Deferred. NEUROMUSCULAR:  Alert and oriented x3.  IMPRESSION:  Right urolithiasis, status post stent  placement.  PLAN:  Right ureteroscopic ureterolithotomy with holmium laser lithotripsy and stent removal.    ______________________________ Charles GurneyMichael Moshe Ellis   ______________________________ Charles GurneyMichael Nels Ellis    MW/MEDQ  D:  01/01/2016  T:  01/01/2016  Job:  [id number]

## 2016-01-01 NOTE — Anesthesia Postprocedure Evaluation (Signed)
Anesthesia Post Note  Patient: Charles Ellis  Procedure(s) Performed: Procedure(s) (LRB): URETEROSCOPY WITH HOLMIUM LASER LITHOTRIPSY (Right) STENT REMOVAL (Right)  Patient location during evaluation: PACU Anesthesia Type: General Level of consciousness: awake and alert Pain management: pain level controlled Vital Signs Assessment: post-procedure vital signs reviewed and stable Respiratory status: spontaneous breathing, nonlabored ventilation, respiratory function stable and patient connected to nasal cannula oxygen Cardiovascular status: blood pressure returned to baseline and stable Postop Assessment: no signs of nausea or vomiting Anesthetic complications: no    Last Vitals:  Vitals:   01/01/16 1349 01/01/16 1434  BP: 126/78 113/77  Pulse: 78 78  Resp:  18  Temp: 36.2 C     Last Pain:  Vitals:   01/01/16 1434  TempSrc:   PainSc: 2                  Cleda MccreedyJoseph K Piscitello

## 2016-01-01 NOTE — Anesthesia Procedure Notes (Signed)
Procedure Name: Intubation Performed by: Charles BurkittHOANG, [name] Pre-anesthesia Checklist: Patient identified, Patient being monitored, Timeout performed, Emergency Drugs available and Suction available Patient Re-evaluated:Patient Re-evaluated prior to inductionOxygen Delivery Method: Circle system utilized Preoxygenation: Pre-oxygenation with 100% oxygen Intubation Type: IV induction Ventilation: Mask ventilation without difficulty Laryngoscope Size: 3 and McGraph Grade View: Grade I Tube type: Oral Tube size: 7.5 mm Number of attempts: 1 Airway Equipment and Method: Stylet,  Bougie stylet and Video-laryngoscopy Placement Confirmation: ETT inserted through vocal cords under direct vision,  positive ETCO2 and breath sounds checked- equal and bilateral Secured at: 22 cm Tube secured with: Tape Dental Injury: Teeth and Oropharynx as per pre-operative assessment  Difficulty Due To: Difficult Airway- due to anterior larynx Future Recommendations: Recommend- induction with short-acting agent, and alternative techniques readily available Comments: Recommend using glidescope with rigid stylet to advance ETT more anteriorly.

## 2016-01-01 NOTE — Op Note (Signed)
Preoperative diagnosis: Right ureterolithiasis  Postoperative diagnosis:    Same  Procedure: 1. Right ureteroscopic ureterolithotomy with holmium laser                                     lithotripsy                        2. Right stent removal                      3. Fluoroscopy  Surgeon: Suszanne ConnersMichael R. Evelene CroonWolff MD  Anesthesia: General  Indications:See the history and physical. After informed consent the above procedure(s) were requested     Technique and findings: After adequate general anesthesia had been obtained the patient is placed into dorsolithotomy position and the perineum prepped and draped in the usual fashion. Fluoroscopy and firm the presence of an 8 mm right upper ureteral calculus and double pigtail stent in good position. Cystoscopy scope was then coupled to the camera and visually advanced into the bladder. No bladder lesions were identified. The right stent was engaged with alligator forceps and pulled out to the urethral meatus.  A 0.035 Glidewire was advanced through the stent and curled into the renal pelvis. The stent was then completely removed, taking care to leave the guidewire in position. The rigid ureteroscope was coupled to the camera and then advanced up the ureter to the level of the stone. The 550  holmium laser fiber was delivered to the level stone and power set at 10 W and a frequency of 5 Hz. The stone was then powdered. The ureteroscope and guidewire were then removed. Fluoroscopy confirmed that no significant residual fragments were visualized. 10 cc of viscous Xylocaine was instilled within the urethra. A B&O suppository was placed. The procedure was then terminated and patient transferred to the recovery room in stable condition.

## 2016-01-01 NOTE — OR Nursing (Signed)
Pt voided into a urinal 450 ml pink tinged urine, no stone particles noted in strainer.

## 2016-01-01 NOTE — H&P (Signed)
Date of Initial H&P: 01/01/16  History reviewed, patient examined, no change in status, stable for surgery. 

## 2016-01-01 NOTE — Telephone Encounter (Signed)
He got schd for a cysto stent removal? Is this what he needs?  Charles Ellis Charles Ellis

## 2016-01-01 NOTE — Telephone Encounter (Signed)
Ok thank you That's what I needed to know

## 2016-01-01 NOTE — Anesthesia Preprocedure Evaluation (Signed)
Anesthesia Evaluation  Patient identified by MRN, date of birth, ID band Patient awake    Reviewed: Allergy & Precautions, H&P , NPO status , Patient's Chart, lab work & pertinent test results  History of Anesthesia Complications Negative for: history of anesthetic complications  Airway Mallampati: III  TM Distance: >3 FB Neck ROM: full    Dental no notable dental hx. (+) Poor Dentition, Chipped   Pulmonary neg pulmonary ROS, neg shortness of breath,    Pulmonary exam normal breath sounds clear to auscultation       Cardiovascular Exercise Tolerance: Good (-) angina(-) Past MI negative cardio ROS Normal cardiovascular exam Rhythm:regular Rate:Normal     Neuro/Psych negative neurological ROS  negative psych ROS   GI/Hepatic negative GI ROS, Neg liver ROS, neg GERD  ,  Endo/Other  negative endocrine ROS  Renal/GU Renal disease     Musculoskeletal   Abdominal   Peds  Hematology negative hematology ROS (+)   Anesthesia Other Findings Signs and symptoms suggestive of sleep apnea   Past Medical History: No date: Hypercholesteremia No date: Kidney stone  Past Surgical History: 12/29/2015: CYSTOSCOPY WITH STENT PLACEMENT Right     Comment: Procedure: CYSTOSCOPY WITH STENT PLACEMENT;                Surgeon: Marcine MatarStephen Dahlstedt, MD;  Location: [facility]              ORS;  Service: Urology;  Laterality: Right; No date: LITHOTRIPSY     Reproductive/Obstetrics negative OB ROS                             Anesthesia Physical Anesthesia Plan  ASA: III  Anesthesia Plan: General ETT   Post-op Pain Management:    Induction:   Airway Management Planned:   Additional Equipment:   Intra-op Plan:   Post-operative Plan:   Informed Consent: I have reviewed the patients History and Physical, chart, labs and discussed the procedure including the risks, benefits and alternatives for the proposed  anesthesia with the patient or authorized representative who has indicated his/her understanding and acceptance.     Plan Discussed with: Anesthesiologist, CRNA and Surgeon  Anesthesia Plan Comments:         Anesthesia Quick Evaluation

## 2016-01-01 NOTE — Transfer of Care (Signed)
Immediate Anesthesia Transfer of Care Note  Patient: Charles Ellis  Procedure(s) Performed: Procedure(s): URETEROSCOPY WITH HOLMIUM LASER LITHOTRIPSY (Right) STENT REMOVAL (Right)  Patient Location: PACU  Anesthesia Type:General  Level of Consciousness: awake, alert  and responds to stimulation  Airway & Oxygen Therapy: Patient Spontanous Breathing and Patient connected to face mask oxygen  Post-op Assessment: Report given to RN and Post -op Vital signs reviewed and stable  Post vital signs: Reviewed and stable  Last Vitals:  Vitals:   01/01/16 1300 01/01/16 1301  BP: 123/71 123/71  Pulse: 96 98  Resp: 19 17  Temp: 36.8 C     Last Pain:  Vitals:   01/01/16 1140  TempSrc: Oral  PainSc: 3          Complications: No apparent anesthesia complications

## 2016-01-01 NOTE — Discharge Instructions (Signed)
Dietary Guidelines to Help Prevent Kidney Stones °Your risk of kidney stones can be decreased by adjusting the foods you eat. The most important thing you can do is drink enough fluid. You should drink enough fluid to keep your urine clear or pale yellow. The following guidelines provide specific information for the type of kidney stone you have had. °GUIDELINES ACCORDING TO TYPE OF KIDNEY STONE °Calcium Oxalate Kidney Stones °· Reduce the amount of salt you eat. Foods that have a lot of salt cause your body to release excess calcium into your urine. The excess calcium can combine with a substance called oxalate to form kidney stones. °· Reduce the amount of animal protein you eat if the amount you eat is excessive. Animal protein causes your body to release excess calcium into your urine. Ask your dietitian how much protein from animal sources you should be eating. °· Avoid foods that are high in oxalates. If you take vitamins, they should have less than 500 mg of vitamin C. Your body turns vitamin C into oxalates. You do not need to avoid fruits and vegetables high in vitamin C. °Calcium Phosphate Kidney Stones °· Reduce the amount of salt you eat to help prevent the release of excess calcium into your urine. °· Reduce the amount of animal protein you eat if the amount you eat is excessive. Animal protein causes your body to release excess calcium into your urine. Ask your dietitian how much protein from animal sources you should be eating. °· Get enough calcium from food or take a calcium supplement (ask your dietitian for recommendations). Food sources of calcium that do not increase your risk of kidney stones include: °¨ Broccoli. °¨ Dairy products, such as cheese and yogurt. °¨ Pudding. °Uric Acid Kidney Stones °· Do not have more than 6 oz of animal protein per day. °FOOD SOURCES °Animal Protein Sources °· Meat (all types). °· Poultry. °· Eggs. °· Fish, seafood. °Foods High in Salt °· Salt seasonings. °· Soy  sauce. °· Teriyaki sauce. °· Cured and processed meats. °· Salted crackers and snack foods. °· Fast food. °· Canned soups and most canned foods. °Foods High in Oxalates °· Grains: °¨ Amaranth. °¨ Barley. °¨ Grits. °¨ Wheat germ. °¨ Bran. °¨ Buckwheat flour. °¨ All bran cereals. °¨ Pretzels. °¨ Whole wheat bread. °· Vegetables: °¨ Beans (wax). °¨ Beets and beet greens. °¨ Collard greens. °¨ Eggplant. °¨ Escarole. °¨ Leeks. °¨ Okra. °¨ Parsley. °¨ Rutabagas. °¨ Spinach. °¨ Swiss chard. °¨ Tomato paste. °¨ Fried potatoes. °¨ Sweet potatoes. °· Fruits: °¨ Red currants. °¨ Figs. °¨ Kiwi. °¨ Rhubarb. °· Meat and Other Protein Sources: °¨ Beans (dried). °¨ Soy burgers and other soybean products. °¨ Miso. °¨ Nuts (peanuts, almonds, pecans, cashews, hazelnuts). °¨ Nut butters. °¨ Sesame seeds and tahini (paste made of sesame seeds). °¨ Poppy seeds. °· Beverages: °¨ Chocolate drink mixes. °¨ Soy milk. °¨ Instant iced tea. °¨ Juices made from high-oxalate fruits or vegetables. °· Other: °¨ Carob. °¨ Chocolate. °¨ Fruitcake. °¨ Marmalades. °  °This information is not intended to replace advice given to you by your health care provider. Make sure you discuss any questions you have with your health care provider. °  °Document Released: 06/19/2010 Document Revised: 02/27/2013 Document Reviewed: 01/19/2013 °Elsevier Interactive Patient Education ©2016 Elsevier Inc. ° ° °Kidney Stones °Kidney stones (urolithiasis) are deposits that form inside your kidneys. The intense pain is caused by the stone moving through the urinary tract. When the stone moves, the   ureter goes into spasm around the stone. The stone is usually passed in the urine.  °CAUSES  °· A disorder that makes certain neck glands produce too much parathyroid hormone (primary hyperparathyroidism). °· A buildup of uric acid crystals, similar to gout in your joints. °· Narrowing (stricture) of the ureter. °· A kidney obstruction present at birth (congenital  obstruction). °· Previous surgery on the kidney or ureters. °· Numerous kidney infections. °SYMPTOMS  °· Feeling sick to your stomach (nauseous). °· Throwing up (vomiting). °· Blood in the urine (hematuria). °· Pain that usually spreads (radiates) to the groin. °· Frequency or urgency of urination. °DIAGNOSIS  °· Taking a history and physical exam. °· Blood or urine tests. °· CT scan. °· Occasionally, an examination of the inside of the urinary bladder (cystoscopy) is performed. °TREATMENT  °· Observation. °· Increasing your fluid intake. °· Extracorporeal shock wave lithotripsy--This is a noninvasive procedure that uses shock waves to break up kidney stones. °· Surgery may be needed if you have severe pain or persistent obstruction. There are various surgical procedures. Most of the procedures are performed with the use of small instruments. Only small incisions are needed to accommodate these instruments, so recovery time is minimized. °The size, location, and chemical composition are all important variables that will determine the proper choice of action for you. Talk to your health care provider to better understand your situation so that you will minimize the risk of injury to yourself and your kidney.  °HOME CARE INSTRUCTIONS  °· Drink enough water and fluids to keep your urine clear or pale yellow. This will help you to pass the stone or stone fragments. °· Strain all urine through the provided strainer. Keep all particulate matter and stones for your health care provider to see. The stone causing the pain may be as small as a grain of salt. It is very important to use the strainer each and every time you pass your urine. The collection of your stone will allow your health care provider to analyze it and verify that a stone has actually passed. The stone analysis will often identify what you can do to reduce the incidence of recurrences. °· Only take over-the-counter or prescription medicines for pain,  discomfort, or fever as directed by your health care provider. °· Keep all follow-up visits as told by your health care provider. This is important. °· Get follow-up X-rays if required. The absence of pain does not always mean that the stone has passed. It may have only stopped moving. If the urine remains completely obstructed, it can cause loss of kidney function or even complete destruction of the kidney. It is your responsibility to make sure X-rays and follow-ups are completed. Ultrasounds of the kidney can show blockages and the status of the kidney. Ultrasounds are not associated with any radiation and can be performed easily in a matter of minutes. °· Make changes to your daily diet as told by your health care provider. You may be told to: °¨ Limit the amount of salt that you eat. °¨ Eat 5 or more servings of fruits and vegetables each day. °¨ Limit the amount of meat, poultry, fish, and eggs that you eat. °· Collect a 24-hour urine sample as told by your health care provider. You may need to collect another urine sample every 6-12 months. °SEEK MEDICAL CARE IF: °· You experience pain that is progressive and unresponsive to any pain medicine you have been prescribed. °SEEK IMMEDIATE MEDICAL CARE IF:  °·   Pain cannot be controlled with the prescribed medicine.  You have a fever or shaking chills.  The severity or intensity of pain increases over 18 hours and is not relieved by pain medicine.  You develop a new onset of abdominal pain.  You feel faint or pass out.  You are unable to urinate.   This information is not intended to replace advice given to you by your health care provider. Make sure you discuss any questions you have with your health care provider.   Document Released: 02/22/2005 Document Revised: 11/13/2014 Document Reviewed: 07/26/2012 Elsevier Interactive Patient Education [employer]  Ureteroscopy Ureteroscopy is a surgical procedure to check for and treat a problem  inside part of your urinary tract. You may need this procedure if you have frequent urinary tract infections, blood in your urine, or a stone in one of the tubes that carries urine from your kidneys to your bladder (ureter). You may also have this procedure if your health care provider wants to check for an abnormal growth in your ureter. To do this, your surgeon passes a thin, flexible telescope (ureteroscope) into one or both ureters. LET [facility] CARE PROVIDER KNOW ABOUT:   Any allergies you have.  All medicines you are taking, including vitamins, herbs, eye drops, creams, and over-the-counter medicines.  Previous problems you or members of your family have had with the use of anesthetics.  Any blood disorders you have.  Previous surgeries you have had.  Medical conditions you have. RISKS AND COMPLICATIONS  Generally, this is a safe procedure. However, as with any procedure, problems can occur. Possible problems include:   Bleeding.  Pain.  Infection.  Scarring that narrows the ureter (stricture).  Creating a hole in the ureter (perforation). BEFORE THE PROCEDURE   You may be given a medicine to make you sleep during ureteroscopy (general anesthetic).  Do not eat or drink anything for 6-8 hours before the procedure.  You may also need to have a urine test before the procedure to check for any sign of infection.  You may be given an antibiotic medicine by injection or through an IV tube before the procedure to prevent infection.  Arrange for someone to take you home after the procedure. PROCEDURE  You may be given one of the following medicines for this procedure:  A medicine that makes you go to sleep (general anesthetic).  A medicine injected into your spine that numbs your body below the waist (spinal anesthetic). This procedure is usually done as outpatient surgery and usually takes about 30 minutes. During the procedure:  The opening from which you urinate  (urethra) will be cleaned with a germ-killing solution.  The ureteroscope will be passed through your urethra into your bladder.  A saltwater solution will flow through the ureteroscope to fill your bladder. This will help the surgeon see the openings of your ureters clearly.  The ureteroscope will then be passed into your ureter.  If a growth is found, your surgeon may take a piece of the growth (biopsy) to examine under a microscope.  If a stone is found, your surgeon may remove the stone through the ureteroscope or break up the stone using a laser, shock waves, or electrical energy.  In some cases, the surgeon may put in a tube that keeps your ureter open (ureteral stent).  When the procedure is over, the surgeon will remove the scope. Then your bladder will be emptied and you will go to the recovery area. AFTER  THE PROCEDURE  After ureteroscopy, you will remain in the recovery area until the anesthesia wears off and your surgeon says you can go home.    This information is not intended to replace advice given to you by your health care provider. Make sure you discuss any questions you have with your health care provider.   Document Released: 02/27/2013 Document Reviewed: 02/27/2013 Elsevier Interactive Patient Education 2016 Elsevier Inc. Recomendaciones dietticas para prevenir la formacin de clculos renales (Dietary Guidelines to Help Prevent Kidney Stones) El riesgo de clculos renales puede disminuir si modifica los alimentos que consume. Lo ms importante es que beba mucho lquido. Debe beber la suficiente cantidad de lquido para mantener la orina clara o de color amarillo plido. Las siguientes recomendaciones brindan informacin especfica para el tipo de clculo renal que usted tiene: RECOMENDACIONES SEGN EL TIPO DE CLCULO RENAL Clculos renales de oxalato de calcio Reduzca la cantidad de sal que ingiere. Los alimentos con mucha sal hacen que el cuerpo libere exceso de  calcio en la orina. El exceso de calcio se puede combinar con una sustancia llamada oxalato y formar clculos renales. Reduzca la cantidad de protenas animales que consume si lo hace en exceso. Las protenas animales hacen que el cuerpo libere exceso de calcio en la orina. Pregunte a su nutricionista qu cantidad de protenas provenientes de animales debera comer. Evite los alimentos con alto contenido de oxalatos. Si toma vitaminas, debe consumir menos de 500 mg de vitaminaC. El cuerpo convierte la vitaminaC en oxalatos. No es necesario que evite las frutas y verduras ricas en vitaminaC. Clculos renales de fosfato de calcio Reduzca la cantidad de sal que ingiere para ayudar a evitar la liberacin de exceso de calcio en la orina. Reduzca la cantidad de protenas animales que consume si lo hace en exceso. Las protenas animales hacen que el cuerpo libere exceso de calcio en la orina. Pregunte a su nutricionista qu cantidad de protenas provenientes de animales debera comer. Consuma calcio a travs de los alimentos o tome un suplemento de calcio (pdale recomendaciones a su nutricionista). Ejemplos de fuentes alimenticias de calcio que no aumentan el riesgo de clculos renales: Brcoli. Productos lcteos, como queso y Dentist. Pudin. Clculos renales de cido rico No consuma ms de 6 onzas (170 g) de protenas animales por da. FUENTES ALIMENTICIAS Iris Pert de protenas animales Carne (todo tipo). Aves. Huevos. Pescado, frutos de mar. Alimentos con alto contenido de sal Condimentos salados. Salsa de soja. Salsa teriyaki. Carnes curadas y procesadas. Galletas saladas y bocadillos. Comida rpida. Sopas enlatadas y Games developer de los alimentos enlatados. Alimentos con alto contenido de oxalatos Granos: Corporate investment banker. Cebada. Smola. Germen de trigo. Salvado. Harina de trigo sarraceno. Todos los cereales de salvado. Pretzels. Pan integral. Verduras: Frijoles (de vaina  amarilla). Remolachas y hojas de Psychiatric nurse. Vincent Peyer. Christella Noa. Escarola. Puerro. Calal. Perejil. Colinabos. Espinaca. Acelga suiza. Extracto de [place]. Papas fritas. Batatas. Nils PyleBenancio Deeds rojas. Higos. Kiwi. Ruibarbo. Carne y otras fuentes de protenas: Frijoles (secos). Hamburguesas de soja y otros productos de soja. Miso. Frutos secos (cacahuetes, almendras, pacanas, castaas, avellanas). Mantequilla de frutos secos. Semillas de ssamo y tahini (pasta hecha de semillas de ssamo). Semillas de amapola. Bebidas: Polvo para bebidas de chocolate. Leche de soja. T helado instantneo. Jugos hechos de frutas o verduras con alto contenido de oxalato. Otros: Algarroba. Chocolate. Torta de frutas. Mermeladas.   Esta informacin no tiene Theme park manager el consejo del mdico. Asegrese de hacerle al mdico cualquier pregunta que tenga.   Document Released:  11/17/2011 Document Revised: 02/27/2013 Elsevier Interactive Patient Education 2016 [employer]  Nucor CorporationClculos renales (Kidney Stones) Los clculos renales (urolitiasis) son masas slidas que se forman en el interior de los riones. El dolor intenso es causado por el movimiento de la piedra a travs del tracto urinario. Cuando la piedra se mueve, el urter hace un espasmo alrededor de la misma. El clculo generalmente se elimina con la [place].  CAUSAS   Un trastorno que hace que ciertas glndulas del cuello produzcan demasiada hormona paratiroidea (hiperparatiroidismo primario).  Una acumulacin de cristales de cido rico, similar a Brewing technologistla gota en las articulaciones.  Estrechamiento (constriccin) del urter.  Obstruccin en el rin presente al nacer (obstruccin congnita).  Cirugas previas del rin o los urteres.  Numerosas infecciones renales. SNTOMAS   Ganas de vomitar (nuseas).  Devolver la comida (vomitar).  Sangre en la orina (hematuria).  Dolor que generalmente se expande (irradia) hacia  la ingle.  Ganas de orinar con frecuencia o de manera urgente. DIAGNSTICO   Historia clnica y examen fsico.  Anlisis de sangre y [place].  Tomografa computada.  En algunos casos se realiza un examen del interior de la vejiga (citoscopa). TRATAMIENTO   Observacin.  Aumentar la ingesta de lquidos.  Litotricia extracorprea con ondas de choque: es un procedimiento no invasivo que [place] Islandsutiliza ondas de choque para romper los clculos renales.  Ser necesaria la ciruga si tiene dolor muy intenso o la obstruccin persiste. Hay varios procedimientos quirrgicos. La mayora de los procedimientos se realizan con el uso de pequeos instrumentos. Slo es Passenger transport managernecesario realizar pequeas incisiones para acomodar estos instrumentos, por lo tanto el tiempo de recuperacin es mnimo. El tamao, la ubicacin y la composicin qumica de los clculos son variables importantes que determinarn la eleccin correcta de tratamiento para su caso. Comunquese con su mdico para comprender mejor su situacin, de modo que pueda ArvinMeritorminimizar los riesgos de lesiones para usted y su rin.  INSTRUCCIONES PARA EL CUIDADO EN EL HOGAR   Beba gran cantidad de lquido para mantener la orina de tono claro o color amarillo plido. Esto ayudar a eliminar las piedras o los fragmentos.  Cuele la orina con el colador que le han provisto. Guarde todas las partculas y piedras para que las vea el profesional que lo asiste. Puede ser tan pequea como un grano de sal. Es muy importante usar el colador cada vez que orine. La recoleccin de piedras permitir al [employer] & Comdico analizar y Investment banker, operationalverificar que efectivamente ha eliminado una piedra. El anlisis de la piedra con frecuencia permitir identificar qu puede hacer para reducir la incidencia de las recurrencias.  Slo tome medicamentos de venta libre o recetados para Primary school teachercalmar el dolor, Environmental health practitionerel malestar o bajar la fiebre, segn las indicaciones de su mdico.  OceanographerConcurra a todas las visitas de control  como se lo haya indicado el mdico. Esto es importante.  Si se lo indica, hgase radiografas. La ausencia de dolor no siempre significa que las piedras se han eliminado. Puede ser que simplemente hayan dejado de moverse. Si el paso de orina permanece completamente obstruido, puede causar prdida de la funcin renal o simplemente la destruccin del rin. Es su responsabilidad Chartered certified accountantcompletar el seguimiento y las radiografas. Las ecografas del rin pueden mostrar una obstruccin y el estado del rin. Las ecografas no se asocian con la radiacin y pueden realizarse fcilmente en cuestin de minutos.  Haga cambios en la dieta diaria como se lo haya indicado el mdico. Es posible que le indiquen lo siguiente:  Limitar la  cantidad de sal que consume.  Consumir 5 o ms porciones de frutas y Warehouse manager.  Limitar la cantidad de carne, carne de ave, pescado y [employer] consume.  Recoger Lauris Poag de orina durante 24 horas como se lo haya indicado el mdico. Tal vez tenga que recoger otra muestra de orina cada 6 o 12 meses. SOLICITE ATENCIN MDICA SI:  Siente dolor que no responde a los Public affairs consultant. SOLICITE ATENCIN MDICA DE INMEDIATO SI:   No puede controlar el dolor con los medicamentos que le han recetado.  Siente escalofros o fiebre.  La gravedad o la intensidad del dolor aumenta durante 18 horas y no se Chief Executive Officer con los analgsicos.  Presenta un nuevo episodio de dolor abdominal.  Sufre mareos o se desmaya.  No puede orinar.   Esta informacin no tiene Theme park manager el consejo del mdico. Asegrese de hacerle al mdico cualquier pregunta que tenga.   Document Released: 02/22/2005 Document Revised: 11/13/2014 Elsevier Interactive Patient Education 2016 [employer]. Ureteroscopia, cuidados posteriores (Ureteroscopy, Care After) Siga estas instrucciones durante las prximas semanas. Estas indicaciones le proporcionan informacin general acerca de cmo  deber cuidarse despus del procedimiento. El mdico tambin podr darle instrucciones ms especficas. El tratamiento se ha planificado de acuerdo a las prcticas mdicas actuales, pero a veces se producen problemas. Comunquese con el mdico si tiene algn problema o tiene dudas despus del procedimiento.  QU ESPERAR DESPUS DEL PROCEDIMIENTO  Despus del procedimiento, es normal tener los siguientes sntomas:   Sensacin de ardor al [employer]ocoPhillips.  Sangre en la orina. INSTRUCCIONES PARA EL CUIDADO EN EL HOGAR   Tome los medicamentos solamente como se lo haya indicado el mdico. No tome ningn medicamento de venta libre para Primary school teacher a menos que el mdico se lo indique.  Tome un bao tibio o coloque un pao tibio sobre la ingle para Stage manager.  Beba suficiente lquido para Photographer orina clara o de color amarillo plido.  Despus de llegar a su casa, beba 2vasos de agua de 8onzas ( ) por hora durante las primeras 2horas.  Contine tomando agua con frecuencia una vez que est en su casa.  Puede seguir su dieta habitual.  Pregntele al Nicholes Mango cundo puede retomar las actividades habituales.  Si le colocaron un tubo para que la orina fluya (stent ureteral), pregntele al mdico cundo debe regresar para que se lo extraigan.  Cumpla con todas las visitas de control. SOLICITE ATENCIN MDICA SI:   Siente escalofros o tiene fiebre.  Tiene ardor despus de las primeras 24horas posteriores al procedimiento.  Tiene sangre en la orina despus de las primeras 24horas posteriores al procedimiento. SOLICITE ATENCIN MDICA DE INMEDIATO SI:   Observa gran cantidad de sangre o cogulos sanguneos en la orina.  Siente Scientific laboratory technician.  Tiene dolor en el pecho o dificultad para respirar.   Esta informacin no tiene Theme park manager el consejo del mdico. Asegrese de hacerle al mdico cualquier pregunta que tenga.   Document Released: 02/27/2013 Elsevier  Interactive Patient Education [employer]

## 2016-01-01 NOTE — Telephone Encounter (Signed)
No.  Just apt with any provider to discuss management of his stone.    Vanna ScotlandAshley Zigmond Trela, MD

## 2016-01-05 NOTE — Anesthesia Postprocedure Evaluation (Signed)
Anesthesia Post Note  Patient: Charles Ellis  Procedure(s) Performed: Procedure(s) (LRB): CYSTOSCOPY WITH STENT PLACEMENT (Right)  Patient location during evaluation: PACU Anesthesia Type: General Level of consciousness: awake and alert Pain management: pain level controlled Vital Signs Assessment: post-procedure vital signs reviewed and stable Respiratory status: spontaneous breathing, nonlabored ventilation, respiratory function stable and patient connected to nasal cannula oxygen Cardiovascular status: blood pressure returned to baseline and stable Postop Assessment: no signs of nausea or vomiting Anesthetic complications: no    Last Vitals:  Vitals:   12/29/15 2144 12/29/15 2205  BP: (!) 142/84 (!) 139/92  Pulse: 99 (!) 105  Resp:    Temp: 36.6 C 36.6 C    Last Pain:  Vitals:   12/29/15 2144  TempSrc:   PainSc: 3                  Yevette EdwardsJames G Adams

## 2016-01-20 ENCOUNTER — Ambulatory Visit: Payer: Self-pay

## 2017-01-14 IMAGING — CT CT RENAL STONE PROTOCOL
2 of 4 series · 16 of 46 positions shown, 18 images · non-contrast
Comparison: None.

CLINICAL DATA: Right-sided flank pain for several hours, initial
encounter

EXAM:
CT ABDOMEN AND PELVIS WITHOUT CONTRAST
TECHNIQUE: Multidetector CT imaging of the abdomen and pelvis was performed
following the standard protocol without IV contrast.

[Series 2: axial st · axial · 0.77mm/px · z∈[-984,-518]mm · 13 of 103 slices shown, 15 images]
[im 5/103  soft-tissue]
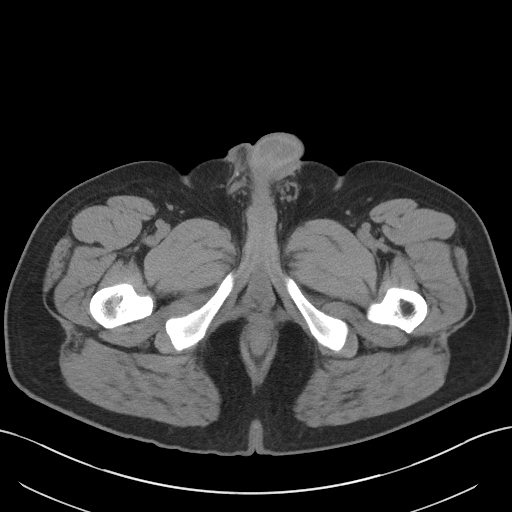
[im 5/103  bone]
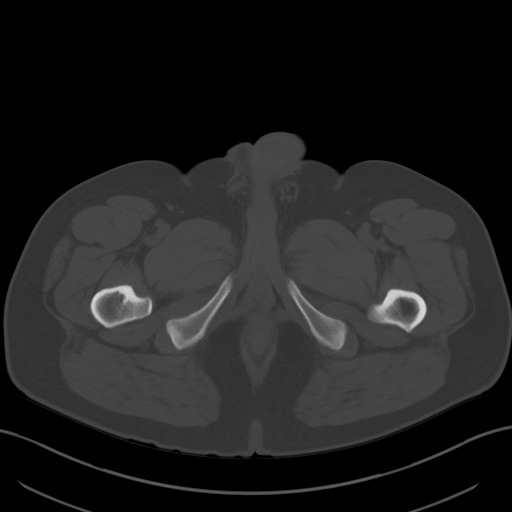
[im 13/103  soft-tissue]
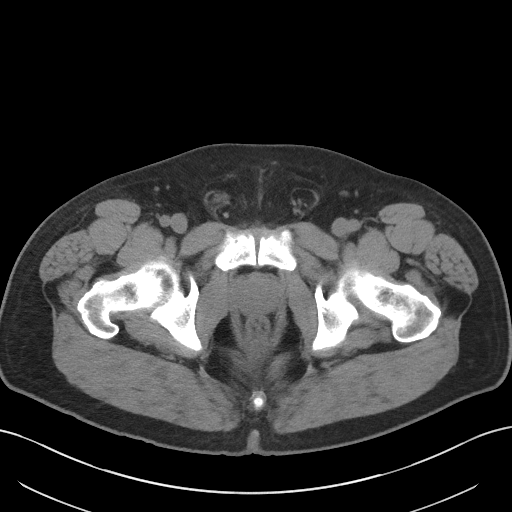
[im 22/103  soft-tissue]
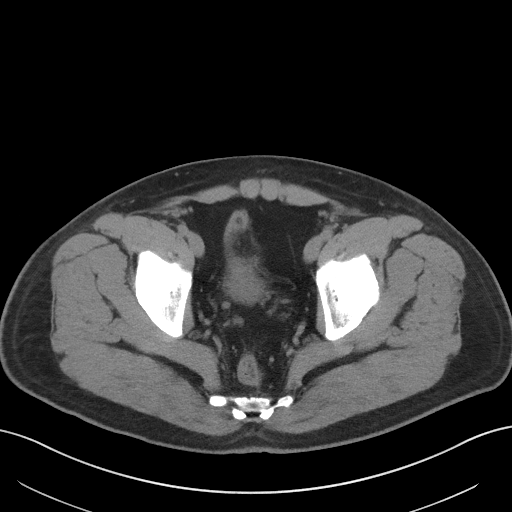
[im 30/103  soft-tissue]
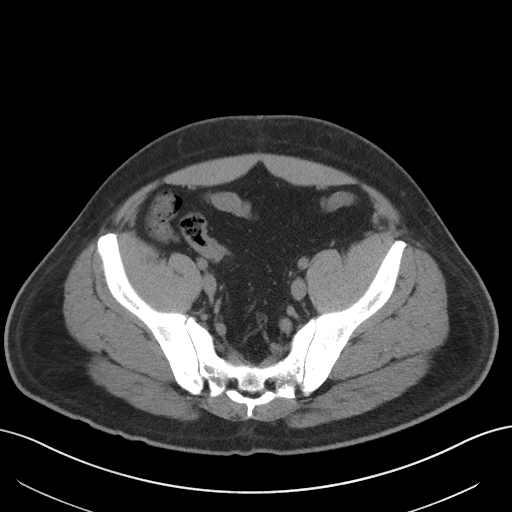
[im 35/103  soft-tissue]
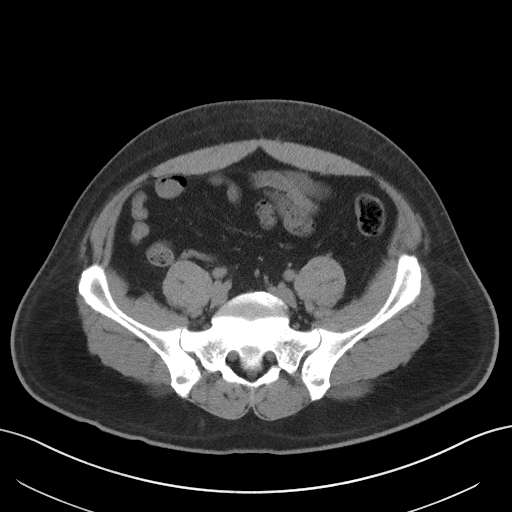
[im 43/103  soft-tissue]
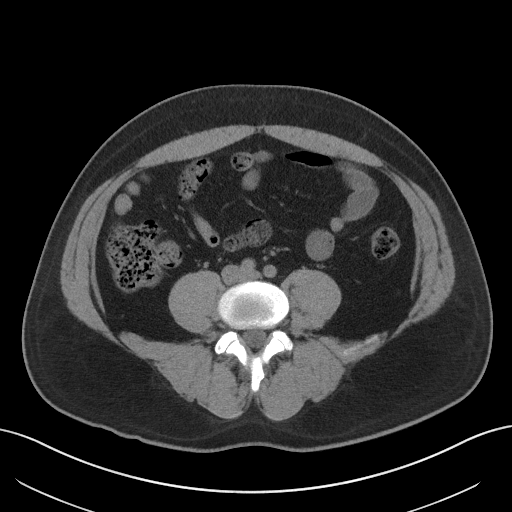
[im 52/103  soft-tissue]
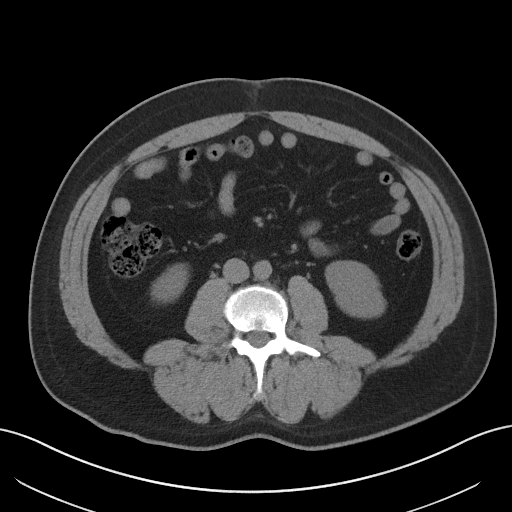
[im 60/103  soft-tissue]
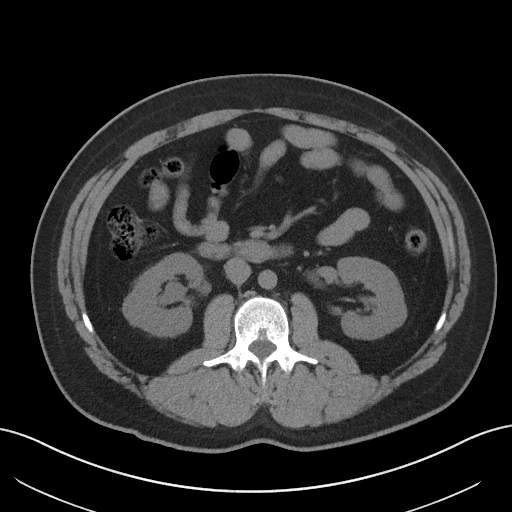
[im 69/103  soft-tissue]
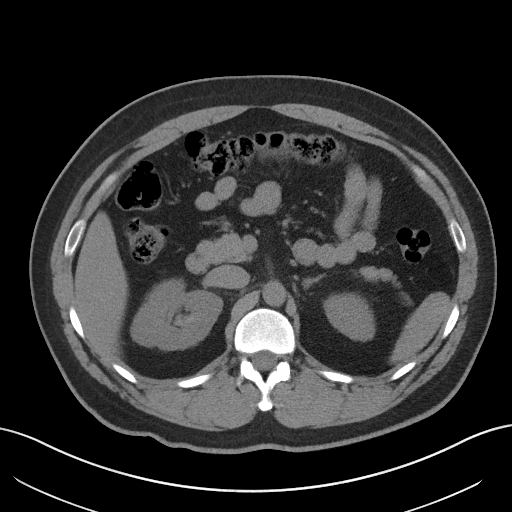
[im 69/103  bone]
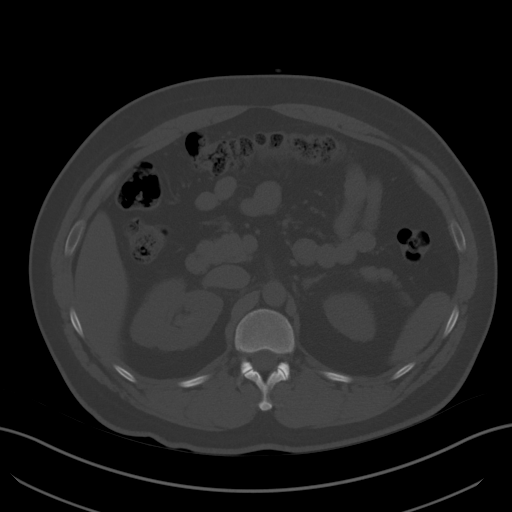
[im 73/103  soft-tissue]
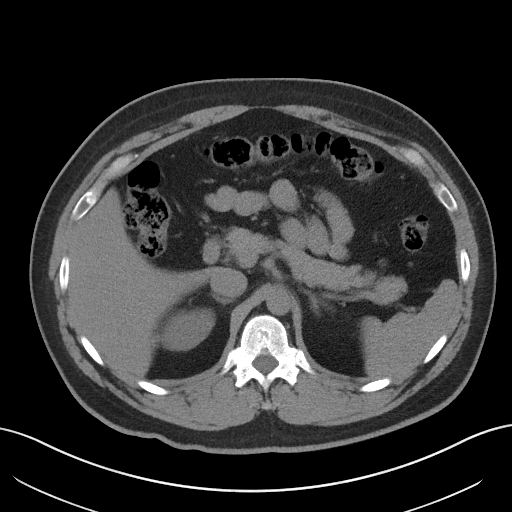
[im 81/103  soft-tissue]
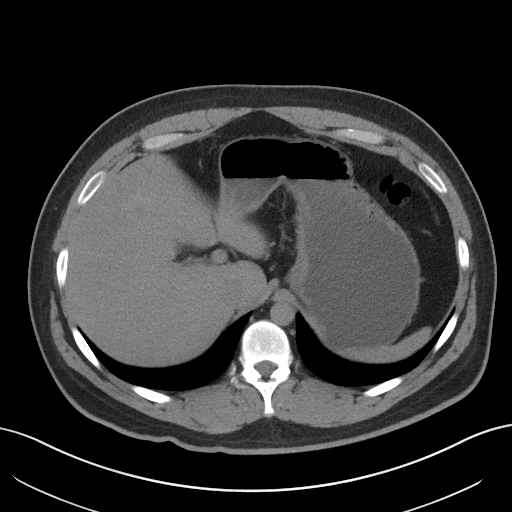
[im 90/103  soft-tissue]
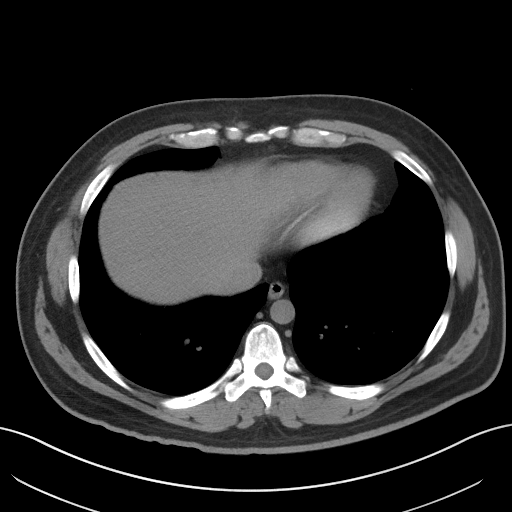
[im 98/103  soft-tissue]
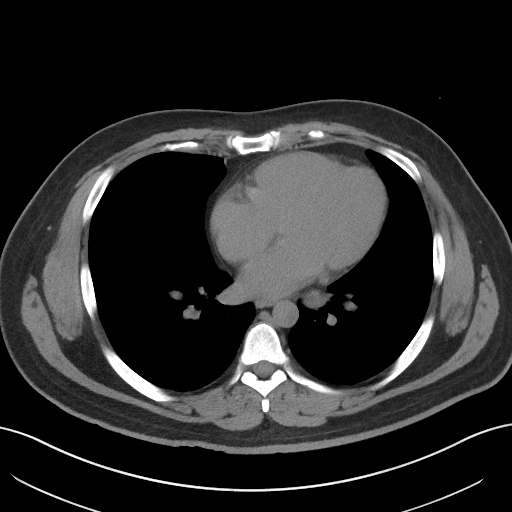

[Series 5: coronal · coronal · 0.76mm/px · 3 of 139 slices shown]
[im 47/139  soft-tissue]
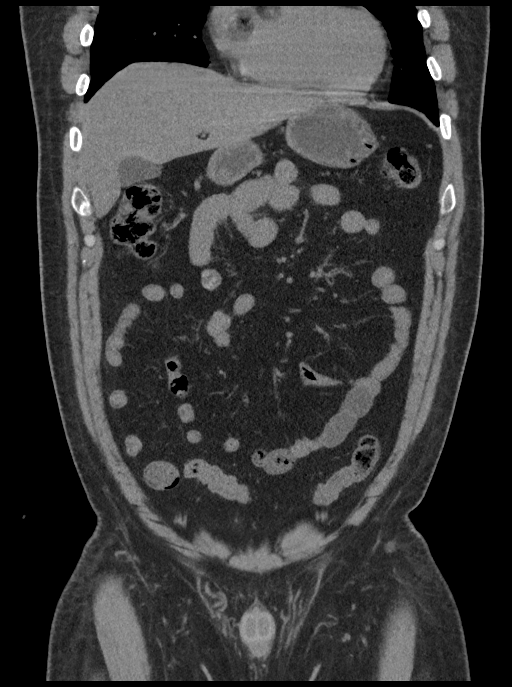
[im 62/139  soft-tissue]
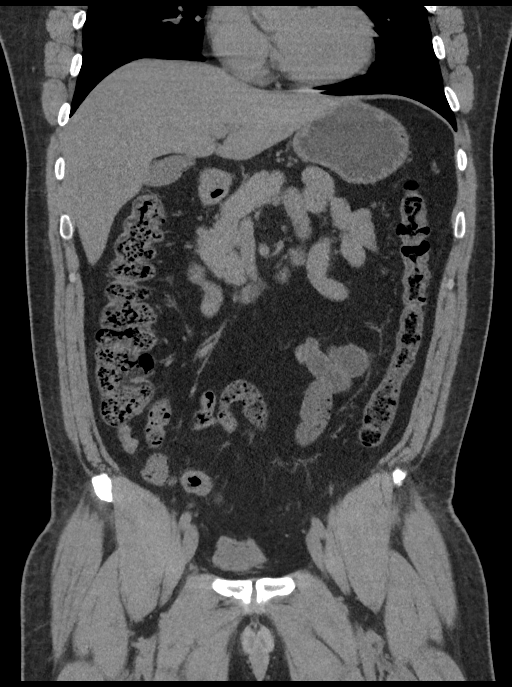
[im 77/139  soft-tissue]
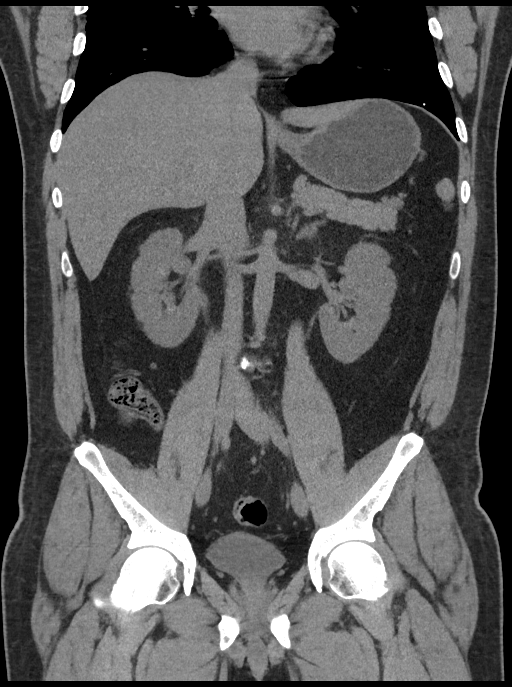

[16 of 46 positions shown; findings below may reference images not displayed]

FINDINGS: Lower chest: No acute abnormality.

Hepatobiliary: No focal liver abnormality is seen. No gallstones,
gallbladder wall thickening, or biliary dilatation.

Pancreas: Unremarkable. No pancreatic ductal dilatation or
surrounding inflammatory changes.

Spleen: Normal in size without focal abnormality.

Adrenals/Urinary Tract: Adrenal glands are within normal limits. The
left kidney demonstrates no obstructive change. A tiny 1-2 mm stone
is noted in the lower pole of the left kidney. The left ureter is
unremarkable. The bladder is decompressed. On the right there is
mild to moderate hydronephrosis secondary to a right proximal
ureteral stone measuring 8 mm in greatest dimension. The more distal
right ureter is within normal limits.

Stomach/Bowel: Stomach is within normal limits. Appendix appears
normal. No evidence of bowel wall thickening, distention, or
inflammatory changes.

Vascular/Lymphatic: No significant vascular findings are present. No
enlarged abdominal or pelvic lymph nodes.

Reproductive: Prostate is unremarkable.

Other: No abdominal wall hernia or abnormality. No abdominopelvic
ascites.

Musculoskeletal: No acute or significant osseous findings.
IMPRESSION: 8 mm proximal right ureteral stone with mild to moderate
hydronephrosis.

## 2017-02-25 DIAGNOSIS — J988 Other specified respiratory disorders: Secondary | ICD-10-CM | POA: Diagnosis not present

## 2017-02-25 DIAGNOSIS — R51 Headache: Secondary | ICD-10-CM | POA: Diagnosis not present

## 2023-05-03 ENCOUNTER — Emergency Department: Payer: BC Managed Care – PPO

## 2023-05-03 ENCOUNTER — Emergency Department
Admission: EM | Admit: 2023-05-03 | Discharge: 2023-05-03 | Disposition: A | Discharge status: Home / Self Care | Payer: BC Managed Care – PPO | Attending: Emergency Medicine | Admitting: Emergency Medicine

## 2023-05-03 ENCOUNTER — Other Ambulatory Visit: Payer: Self-pay

## 2023-05-03 DIAGNOSIS — N201 Calculus of ureter: Secondary | ICD-10-CM

## 2023-05-03 DIAGNOSIS — R109 Unspecified abdominal pain: Secondary | ICD-10-CM | POA: Diagnosis present

## 2023-05-03 DIAGNOSIS — N132 Hydronephrosis with renal and ureteral calculous obstruction: Secondary | ICD-10-CM | POA: Insufficient documentation

## 2023-05-03 LAB — BASIC METABOLIC PANEL
Anion gap: 11 (ref 5–15)
BUN: 11 mg/dL (ref 6–20)
CO2: 24 mmol/L (ref 22–32)
Calcium: 9.5 mg/dL (ref 8.9–10.3)
Chloride: 103 mmol/L (ref 98–111)
Creatinine, Ser: 0.77 mg/dL (ref 0.61–1.24)
GFR, Estimated: >60 mL/min (ref >60)
Glucose, Bld: 133 mg/dL — ABNORMAL HIGH (ref 70–99)
Potassium: 4.1 mmol/L (ref 3.5–5.1)
Sodium: 138 mmol/L (ref 135–145)

## 2023-05-03 LAB — CBC WITH DIFFERENTIAL/PLATELET
Abs Immature Granulocytes: 0.01 10*3/uL (ref 0.00–0.07)
Basophils Absolute: 0 10*3/uL (ref 0.0–0.1)
Basophils Relative: 0 %
Eosinophils Absolute: 0.1 10*3/uL (ref 0.0–0.5)
Eosinophils Relative: 1 %
HCT: 45 % (ref 39.0–52.0)
Hemoglobin: 15 g/dL (ref 13.0–17.0)
Immature Granulocytes: 0 %
Lymphocytes Relative: 22 %
Lymphs Abs: 1.5 10*3/uL (ref 0.7–4.0)
MCH: 29.1 pg (ref 26.0–34.0)
MCHC: 33.3 g/dL (ref 30.0–36.0)
MCV: 87.4 fL (ref 80.0–100.0)
Monocytes Absolute: 0.4 10*3/uL (ref 0.1–1.0)
Monocytes Relative: 6 %
Neutro Abs: 5 10*3/uL (ref 1.7–7.7)
Neutrophils Relative %: 71 %
Platelets: 235 10*3/uL (ref 150–400)
RBC: 5.15 MIL/uL (ref 4.22–5.81)
RDW: 12.2 % (ref 11.5–15.5)
WBC: 7.1 10*3/uL (ref 4.0–10.5)
nRBC: 0 % (ref 0.0–0.2)

## 2023-05-03 LAB — URINALYSIS, ROUTINE W REFLEX MICROSCOPIC
Bilirubin Urine: NEGATIVE
Glucose, UA: NEGATIVE mg/dL
Ketones, ur: NEGATIVE mg/dL
Leukocytes,Ua: NEGATIVE
Nitrite: NEGATIVE
Protein, ur: 30 mg/dL — AB
RBC / HPF: >50 RBC/hpf (ref 0–5)
Specific Gravity, Urine: 1.013 (ref 1.005–1.030)
pH: 6 (ref 5.0–8.0)

## 2023-05-03 MED ORDER — SODIUM CHLORIDE 0.9 % IV BOLUS
1000.0000 mL | Freq: Once | INTRAVENOUS | Status: AC
  Administered 2023-05-03: 1000 mL via INTRAVENOUS

## 2023-05-03 MED ORDER — ONDANSETRON HCL 4 MG/2ML IJ SOLN
4.0000 mg | Freq: Once | INTRAMUSCULAR | Status: AC
  Administered 2023-05-03: 4 mg via INTRAVENOUS
  Filled 2023-05-03: qty 2

## 2023-05-03 MED ORDER — OXYCODONE-ACETAMINOPHEN 5-325 MG PO TABS
1.0000 | ORAL_TABLET | Freq: Once | ORAL | Status: AC
  Administered 2023-05-03: 1 via ORAL
  Filled 2023-05-03: qty 1

## 2023-05-03 MED ORDER — OXYCODONE HCL 5 MG PO TABS: 5.0000 mg | ORAL_TABLET | Freq: Three times a day (TID) | ORAL | 0 refills | Status: AC | PRN

## 2023-05-03 MED ORDER — KETOROLAC TROMETHAMINE 15 MG/ML IJ SOLN
15.0000 mg | Freq: Once | INTRAMUSCULAR | Status: AC
  Administered 2023-05-03: 15 mg via INTRAVENOUS
  Filled 2023-05-03: qty 1

## 2023-05-03 MED ORDER — TAMSULOSIN HCL 0.4 MG PO CAPS: 0.4000 mg | ORAL_CAPSULE | Freq: Every day | ORAL | 0 refills | Status: DC

## 2023-05-03 MED ORDER — ONDANSETRON HCL 4 MG PO TABS: 4.0000 mg | ORAL_TABLET | Freq: Three times a day (TID) | ORAL | 0 refills | Status: AC | PRN

## 2023-05-03 NOTE — Discharge Instructions (Signed)
 You can take ibuprofen 600 mg every 8 hours scheduled for the next 3 to 4 days and then take as needed afterwards.  Please take that with food or water.  You can also take Tylenol as needed.  I have sent stronger medication to your pharmacy as well to help with pain that is not relieved by ibuprofen.  Only take this as needed.  Do not drive on this medication as it can make you sleepy.  I have also sent nausea medication as well as an additional medication that will try to help pass the stone.  Please follow-up with urology to ensure passage of stone.

## 2023-05-03 NOTE — ED Triage Notes (Signed)
 Pt reports right side flank pain that began Monday and blood in urine this morning. Pt has hx kidney stones. Denies difficulty urinating.

## 2023-05-03 NOTE — ED Provider Notes (Signed)
 [facility] Provider Note    Event Date/Time   First MD Initiated Contact with Patient 05/03/23 [phone]     (approximate)   History   Flank Pain   HPI Charles Ellis is a 43 y.o. male with prior history of kidney stones presenting today for right flank pain.  Patient states yesterday he had onset of right-sided flank pain which was intermittent.  It has been associated with nausea and vomiting and slight chills.  He also noted blood in his urine.  States similar symptoms with prior kidney stone.  Otherwise denies chest pain, shortness of breath, other abdominal pain, diarrhea, constipation, dysuria.     Physical Exam   Triage Vital Signs: ED Triage Vitals  Encounter Vitals Group     BP 05/03/23 0533 130/84     Systolic BP Percentile --      Diastolic BP Percentile --      Pulse Rate 05/03/23 0533 74     Resp 05/03/23 0533 18     Temp 05/03/23 0533 98.6 F (37 C)     Temp Source 05/03/23 0533 Oral     SpO2 05/03/23 0533 100 %     Weight 05/03/23 0532 210 lb (95.3 kg)     Height 05/03/23 0532 5\' 4"  (1.626 m)     Head Circumference --      Peak Flow --      Pain Score 05/03/23 0532 8     Pain Loc --      Pain Education --      Exclude from Growth Chart --     Most recent vital signs: Vitals:   05/03/23 0800 05/03/23 0848  BP: 103/70 103/70  Pulse: 84 84  Resp:  18  Temp:  97.8 F (36.6 C)  SpO2: 100% 100%   Physical Exam: I have reviewed the vital signs and nursing notes. General: Awake, alert, no acute distress.  Nontoxic appearing. Head:  Atraumatic, normocephalic.   ENT:  EOM intact, PERRL. Oral mucosa is pink and moist with no lesions. Neck: Neck is supple with full range of motion, No meningeal signs. Cardiovascular:  RRR, No murmurs. Peripheral pulses palpable and equal bilaterally. Respiratory:  Symmetrical chest wall expansion.  No rhonchi, rales, or wheezes.  Good air movement throughout.  No use of accessory muscles.    Musculoskeletal:  No cyanosis or edema. Moving extremities with full ROM Abdomen:  Soft, right-sided CVA tenderness, nondistended. Neuro:  GCS 15, moving all four extremities, interacting appropriately. Speech clear. Psych:  Calm, appropriate.   Skin:  Warm, dry, no rash.    ED Results / Procedures / Treatments   Labs (all labs ordered are listed, but only abnormal results are displayed) Labs Reviewed  BASIC METABOLIC PANEL - Abnormal; Notable for the following components:      Result Value   Glucose, Bld 133 (*)    All other components within normal limits  URINALYSIS, ROUTINE W REFLEX MICROSCOPIC - Abnormal; Notable for the following components:   Color, Urine YELLOW (*)    APPearance HAZY (*)    Hgb urine dipstick LARGE (*)    Protein, ur 30 (*)    Bacteria, UA FEW (*)    All other components within normal limits  CBC WITH DIFFERENTIAL/PLATELET     EKG    RADIOLOGY Independently interpreted CT abdomen/pelvis showing evidence of right sided mid ureter ureterolithiasis   PROCEDURES:  Critical Care performed: No  Procedures   MEDICATIONS ORDERED IN  ED: Medications  oxyCODONE-acetaminophen (PERCOCET/ROXICET) 5-325 MG per tablet 1 tablet (1 tablet Oral Given 05/03/23 0538)  ketorolac (TORADOL) 15 MG/ML injection 15 mg (15 mg Intravenous Given 05/03/23 0720)  sodium chloride 0.9 % bolus 1,000 mL (0 mLs Intravenous Stopped 05/03/23 0847)  ondansetron (ZOFRAN) injection 4 mg (4 mg Intravenous Given 05/03/23 0725)     IMPRESSION / MDM / ASSESSMENT AND PLAN / ED COURSE  I reviewed the triage vital signs and the nursing notes.                              Differential diagnosis includes, but is not limited to, nephrolithiasis, ureterolithiasis, pyelonephritis, acute cystitis, appendicitis, colitis, enteritis  Patient's presentation is most consistent with acute complicated illness / injury requiring diagnostic workup.  Patient is a 43 year old male presenting today  for right-sided flank pain associated with nausea and vomiting and prior history of kidney stones.  Vital signs are stable and physical exam shows right-sided CVA tenderness.  Laboratory workup with CBC and BMP largely reassuring at this time.  CT imaging does show right-sided kidney stone.  Patient was symptomatically treated with Toradol, Zofran, and 1 L of fluid.  Patient feeling significantly better.  UA with no signs of infection.  Vital signs still stable.  Will discharge with urology follow-up.  Given strict return precautions for any worsening symptoms.  The patient is on the cardiac monitor to evaluate for evidence of arrhythmia and/or significant heart rate changes. Clinical Course as of 05/03/23 0915  Tue May 03, 2023  0831 Reassessed patient.  Feeling significantly better at this time with minimal pain complaints.  He is going to provide a urine sample at this time for possible discharge if no signs of infection [DW]  0909 Urinalysis, Routine w reflex microscopic -Urine, Clean Catch(!) No WBCs, leukocytes, or nitrites to suggest UTI.  Will discharge with pain and nausea medication and outpatient urology follow-up. [DW]    Clinical Course User Index [DW] Janith Lima, MD     FINAL CLINICAL IMPRESSION(S) / ED DIAGNOSES   Final diagnoses:  Ureterolithiasis     Rx / DC Orders   ED Discharge Orders          Ordered    ondansetron (ZOFRAN) 4 MG tablet  Every 8 hours PRN        05/03/23 0915    oxyCODONE (ROXICODONE) 5 MG immediate release tablet  Every 8 hours PRN        05/03/23 0915    tamsulosin (FLOMAX) 0.4 MG CAPS capsule  Daily        05/03/23 0915    Ambulatory referral to Urology       Comments: Recurrent nephrolithiasis   05/03/23 0915             Note:  This document was prepared using Dragon voice recognition software and may include unintentional dictation errors.   Janith Lima, MD 05/03/23 [phone]

## 2023-05-03 NOTE — ED Notes (Signed)
 Pt unable to provide a urine sample at this time.

## 2023-05-13 ENCOUNTER — Encounter: Payer: Self-pay | Admitting: Urology

## 2023-05-13 ENCOUNTER — Ambulatory Visit (INDEPENDENT_AMBULATORY_CARE_PROVIDER_SITE_OTHER): Payer: 59 | Admitting: Urology

## 2023-05-13 VITALS — BP 136/86 | HR 86 | Ht 65.0 in | Wt 210.0 lb

## 2023-05-13 DIAGNOSIS — N529 Male erectile dysfunction, unspecified: Secondary | ICD-10-CM

## 2023-05-13 DIAGNOSIS — N201 Calculus of ureter: Secondary | ICD-10-CM

## 2023-05-13 MED ORDER — TADALAFIL 20 MG PO TABS: ORAL_TABLET | ORAL | 0 refills | Status: AC

## 2023-05-13 NOTE — Progress Notes (Signed)
 I, Charles Ellis, acting as a scribe for Charles Altes, MD., have documented all relevant documentation on the behalf of Charles Altes, MD, as directed by Charles Altes, MD while in the presence of Charles Altes, MD.  05/13/2023 11:23 AM   Charles Ellis Mar 04, 1981 [id number]  Referring provider: Marisue Ivan, MD [id number] [facility] [address] [facility] [place],  [place] [address]  Chief Complaint  Patient presents with   Erectile Dysfunction    HPI: Charles Ellis is a 43 y.o. male presents for evaluation of erectile dysfunction.   6 month history of difficulty maintaining an erection.  He states he can achieve a firm erection, however will lose it prior to penetration. He is married, and no increased psychological stressors.  Organic risk factors, hyperlipidemia; no previous tobacco history. He has a good libido but does complain of tiredness.  No pain or curvature with erection. He does have a history of recurrent stone disease, and has previously seen Charles Ellis and Charles Ellis. He has had prior ureteroscopic stone removals.  He was seen in the ED 05/03/23 and found to have a 3 x 6 mm ureteral calculus. He is minimally symptomatic at present.    PMH: Past Medical History:  Diagnosis Date   Difficult intubation    ED (erectile dysfunction)    Hypercholesteremia    Kidney stone     Surgical History: Past Surgical History:  Procedure Laterality Date   CYSTOSCOPY WITH STENT PLACEMENT Right 12/29/2015   Procedure: CYSTOSCOPY WITH STENT PLACEMENT;  Surgeon: Charles Matar, MD;  Location: [facility] OR[facility];  Service: Urology;  Laterality: Right;   LITHOTRIPSY     STENT REMOVAL Right 01/01/2016   Procedure: STENT REMOVAL;  Surgeon: Charles Ape, MD;  Location: [facility];  Service: Urology;  Laterality: Right;   URETEROSCOPY WITH HOLMIUM LASER LITHOTRIPSY Right 01/01/2016   Procedure: URETEROSCOPY WITH HOLMIUM LASER LITHOTRIPSY;  Surgeon: Charles Ape, MD;   Location: [facility] OR[facility];  Service: Urology;  Laterality: Right;    Home Medications:  Allergies as of 05/13/2023       Reactions   Atorvastatin    Other Reaction(s): Other (See Comments) Pt was unable to sleep on med        Medication List        Accurate as of May 13, 2023 11:23 AM. If you have any questions, ask your nurse or doctor.          STOP taking these medications    docusate sodium 100 MG capsule Commonly known as: COLACE Stopped by: Charles Ellis   levofloxacin 500 MG tablet Commonly known as: Levaquin Stopped by: Charles Ellis   Nucynta 50 MG tablet Generic drug: tapentadol Stopped by: Charles Ellis   ondansetron 8 MG disintegrating tablet Commonly known as: Zofran ODT Stopped by: Charles Ellis   oxyCODONE-acetaminophen 10-325 MG tablet Commonly known as: PERCOCET Stopped by: Charles Ellis       TAKE these medications    Calcium Carb-Cholecalciferol 600-10 MG-MCG Tabs Take by mouth 2 (two) times daily with a meal.   ibuprofen 200 MG tablet Commonly known as: ADVIL Take 200 mg by mouth every 6 (six) hours as needed.   ondansetron 4 MG tablet Commonly known as: Zofran Take 1 tablet (4 mg total) by mouth every 8 (eight) hours as needed for vomiting or nausea.   oxyCODONE 5 MG immediate release tablet Commonly known as: Roxicodone Take 1 tablet (  5 mg total) by mouth every 8 (eight) hours as needed for severe pain (pain score 7-10) or breakthrough pain.   rosuvastatin 10 MG tablet Commonly known as: CRESTOR Take 1 tablet by mouth at bedtime.   tadalafil 20 MG tablet Commonly known as: CIALIS Take 1 tab 1 hour prior to intercourse Started by: Charles Ellis   tamsulosin 0.4 MG Caps capsule Commonly known as: FLOMAX Take 1 capsule (0.4 mg total) by mouth daily. What changed: Another medication with the same name was removed. Continue taking this medication, and follow the directions you see here. Changed by: Charles Ellis         Allergies:  Allergies  Allergen Reactions   Atorvastatin     Other Reaction(s): Other (See Comments)  Pt was unable to sleep on med   Social History:  reports that he has never smoked. He has never used smokeless tobacco. He reports current alcohol use. He reports that he does not use drugs.   Physical Exam: BP 136/86 (BP Location: Left Arm, Patient Position: Sitting, Cuff Size: Large)   Pulse 86   Ht 5\' 5"  (1.651 m)   Wt 210 lb (95.3 kg)   BMI 34.95 kg/m   Constitutional:  Alert and oriented, No acute distress. HEENT: [place] AT, moist mucus membranes.  Trachea midline, no masses. Cardiovascular: No clubbing, cyanosis, or edema. Respiratory: Normal respiratory effort, no increased work of breathing. GU: Phallus uncircumcised. No corporal plaques or palpable abnormalities. Testes descended bilaterally, without masses or tenderness  Psychiatric: Normal mood and affect.   Pertinent Imaging: CT was personally reviewed and interpreted.   CT Renal Stone Study  Narrative CLINICAL DATA:  Abdominal/flank pain with stone suspected. Hematuria  EXAM: CT ABDOMEN AND PELVIS WITHOUT CONTRAST  TECHNIQUE: Multidetector CT imaging of the abdomen and pelvis was performed following the standard protocol without IV contrast.  RADIATION DOSE REDUCTION: This exam was performed according to the departmental dose-optimization program which includes automated exposure control, adjustment of the mA and/or kV according to patient size and/or use of iterative reconstruction technique.  COMPARISON:  12/27/2015  FINDINGS: Lower chest:  No contributory findings.  Hepatobiliary: No focal liver abnormality.No evidence of biliary obstruction or stone.  Pancreas: Unremarkable.  Spleen: Unremarkable.  Adrenals/Urinary Tract: Negative adrenals. Right hydronephrosis and low-density renal expansion related to a ureteral calculus measuring 6 x 3 mm in the mid ureter, just above the iliac  crossing. Two smaller left renal calculi. Two punctate right upper pole renal calculus. Unremarkable bladder.  Stomach/Bowel:  No obstruction. No appendicitis.  Vascular/Lymphatic: No acute vascular abnormality. No mass or adenopathy.  Reproductive:No pathologic findings.  Other: No ascites or pneumoperitoneum.  Tiny fatty umbilical hernia.  Musculoskeletal: No acute abnormalities. 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  IMPRESSION: 1. Obstructing 6 x 3 mm stone in the mid right ureter. 2. Small bilateral renal calculi.   Electronically Signed By: Tiburcio Pea M.D. On: 05/03/2023 07:07   Assessment & Plan:    1. Erectile dysfunction He achieves erections and has difficulty maintaining. We discussed the possibility of veno-occlusive disease and discussed the use of a venous compression band.  Since he does achieve satisfactory erections, a PDE5 inhibitor may not be effective, however he was interested in a trial. An Rx tadalafil 20 mg sent to pharmacy Check testosterone level  2. Right ureteral calculus Minimally symptomatic. He declined to KUB today, and indicated he would follow up for increasing  pain or inability to pass these stones.  I have reviewed the above documentation for accuracy and completeness, and I agree with the above.   Charles Altes, MD  [facility] [address], [address] [place], [place] [address] [phone]

## 2023-05-14 LAB — TESTOSTERONE: Testosterone: 293 ng/dL (ref 264–916)

## 2023-05-16 ENCOUNTER — Other Ambulatory Visit: Payer: Self-pay | Admitting: *Deleted

## 2023-05-16 DIAGNOSIS — R7989 Other specified abnormal findings of blood chemistry: Secondary | ICD-10-CM

## 2023-05-16 DIAGNOSIS — N529 Male erectile dysfunction, unspecified: Secondary | ICD-10-CM

## 2023-05-26 ENCOUNTER — Other Ambulatory Visit

## 2023-05-26 DIAGNOSIS — R7989 Other specified abnormal findings of blood chemistry: Secondary | ICD-10-CM

## 2023-06-01 ENCOUNTER — Telehealth: Payer: Self-pay

## 2023-06-01 DIAGNOSIS — R7989 Other specified abnormal findings of blood chemistry: Secondary | ICD-10-CM

## 2023-06-01 LAB — TESTOSTERONE,FREE AND TOTAL
Testosterone, Free: 8.9 pg/mL (ref 6.8–21.5)
Testosterone: 307 ng/dL (ref 264–916)

## 2023-06-01 LAB — LUTEINIZING HORMONE: LH: 6 m[IU]/mL (ref 1.7–8.6)

## 2023-06-01 NOTE — Telephone Encounter (Signed)
 Spoke with patient.  He would like to try clomid.

## 2023-06-01 NOTE — Progress Notes (Signed)
 Patient aware.

## 2023-06-01 NOTE — Telephone Encounter (Signed)
-----   Message from Verna [facility] sent at 06/01/2023  9:36 AM EDT ----- Testosterone level was low normal.  We could try him on Clomid which will stimulate his testes to produce more testosterone for 2 months to see if this improves his ED.  This medication is used off label so not covered by insurance.  It runs ~ $70 per month.  If he desires a trial will send in Rx

## 2023-06-03 MED ORDER — CLOMIPHENE CITRATE 50 MG PO TABS
25.0000 mg | ORAL_TABLET | Freq: Every day | ORAL | 0 refills | Status: AC
Start: 2023-06-03 — End: ?

## 2023-06-06 NOTE — Telephone Encounter (Signed)
 Notified patient as instructed, patient pleased. Discussed follow-up appointments, patient agrees

## 2023-06-06 NOTE — Telephone Encounter (Signed)
 Left message for patient to return the call.

## 2023-07-20 ENCOUNTER — Other Ambulatory Visit

## 2023-07-25 ENCOUNTER — Other Ambulatory Visit

## 2023-08-09 ENCOUNTER — Other Ambulatory Visit

## 2023-08-09 DIAGNOSIS — N529 Male erectile dysfunction, unspecified: Secondary | ICD-10-CM

## 2023-08-10 LAB — TESTOSTERONE: Testosterone: 362 ng/dL (ref 264–916)

## 2023-08-13 ENCOUNTER — Ambulatory Visit: Payer: Self-pay | Admitting: Urology

## 2023-10-20 ENCOUNTER — Emergency Department
Admission: EM | Admit: 2023-10-20 | Discharge: 2023-10-20 | Discharge status: Against Medical Advice | Attending: Emergency Medicine | Admitting: Emergency Medicine

## 2023-10-20 ENCOUNTER — Other Ambulatory Visit: Payer: Self-pay

## 2023-10-20 DIAGNOSIS — I959 Hypotension, unspecified: Secondary | ICD-10-CM | POA: Insufficient documentation

## 2023-10-20 DIAGNOSIS — Z5321 Procedure and treatment not carried out due to patient leaving prior to being seen by health care provider: Secondary | ICD-10-CM | POA: Diagnosis not present

## 2023-10-20 LAB — BASIC METABOLIC PANEL WITH GFR
Anion gap: 7 (ref 5–15)
BUN: 14 mg/dL (ref 6–20)
CO2: 24 mmol/L (ref 22–32)
Calcium: 8.7 mg/dL — ABNORMAL LOW (ref 8.9–10.3)
Chloride: 109 mmol/L (ref 98–111)
Creatinine, Ser: 1.05 mg/dL (ref 0.61–1.24)
GFR, Estimated: >60 mL/min (ref >60)
Glucose, Bld: 106 mg/dL — ABNORMAL HIGH (ref 70–99)
Potassium: 3.7 mmol/L (ref 3.5–5.1)
Sodium: 140 mmol/L (ref 135–145)

## 2023-10-20 LAB — CBC
HCT: 41.7 % (ref 39.0–52.0)
Hemoglobin: 14.3 g/dL (ref 13.0–17.0)
MCH: 29.7 pg (ref 26.0–34.0)
MCHC: 34.3 g/dL (ref 30.0–36.0)
MCV: 86.7 fL (ref 80.0–100.0)
Platelets: 213 K/uL (ref 150–400)
RBC: 4.81 MIL/uL (ref 4.22–5.81)
RDW: 12.1 % (ref 11.5–15.5)
WBC: 5.6 K/uL (ref 4.0–10.5)
nRBC: 0 % (ref 0.0–0.2)

## 2023-10-20 LAB — TROPONIN I (HIGH SENSITIVITY): Troponin I (High Sensitivity): 11 ng/L (ref <18)

## 2023-10-20 NOTE — ED Notes (Signed)
 Patient says he is feeling better and no longer wants to stay to be seen. IV's were removed, MSE wavier reinforced, pt ambulatory to POV.

## 2023-10-20 NOTE — ED Triage Notes (Signed)
 Pt was bib EMS for syncope while working outside. Per EMS pt BP was very low. EMS gave pt 1500 ml of IV fluid. Pt has bilateral 18g IV's.
